# Patient Record
Sex: Female | Born: 1980 | Race: Black or African American | Hispanic: No | Marital: Single | Smoking: Never smoker
Health system: Southern US, Community
[De-identification: ages and names within clinical notes are randomized; demographics above are authoritative.]

## PROBLEM LIST (undated history)

## (undated) ENCOUNTER — Inpatient Hospital Stay (HOSPITAL_COMMUNITY): Payer: Self-pay

## (undated) DIAGNOSIS — N390 Urinary tract infection, site not specified: Secondary | ICD-10-CM

## (undated) DIAGNOSIS — R87619 Unspecified abnormal cytological findings in specimens from cervix uteri: Secondary | ICD-10-CM

## (undated) DIAGNOSIS — A599 Trichomoniasis, unspecified: Secondary | ICD-10-CM

## (undated) DIAGNOSIS — O139 Gestational [pregnancy-induced] hypertension without significant proteinuria, unspecified trimester: Secondary | ICD-10-CM

## (undated) DIAGNOSIS — A749 Chlamydial infection, unspecified: Secondary | ICD-10-CM

## (undated) HISTORY — PX: COLPOSCOPY: SHX161

## (undated) HISTORY — PX: DILATION AND CURETTAGE OF UTERUS: SHX78

---
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
## 2000-02-18 ENCOUNTER — Ambulatory Visit (HOSPITAL_COMMUNITY): Admission: RE | Admit: 2000-02-18 | Discharge: 2000-02-18 | Payer: Self-pay | Admitting: *Deleted

## 2000-02-27 ENCOUNTER — Inpatient Hospital Stay (HOSPITAL_COMMUNITY): Admission: AD | Admit: 2000-02-27 | Discharge: 2000-02-27 | Payer: Self-pay | Admitting: Obstetrics

## 2000-04-13 ENCOUNTER — Ambulatory Visit (HOSPITAL_COMMUNITY): Admission: RE | Admit: 2000-04-13 | Discharge: 2000-04-13 | Payer: Self-pay | Admitting: *Deleted

## 2000-04-13 ENCOUNTER — Encounter: Payer: Self-pay | Admitting: *Deleted

## 2000-05-21 ENCOUNTER — Inpatient Hospital Stay (HOSPITAL_COMMUNITY): Admission: AD | Admit: 2000-05-21 | Discharge: 2000-05-21 | Payer: Self-pay | Admitting: Obstetrics & Gynecology

## 2000-06-04 ENCOUNTER — Inpatient Hospital Stay (HOSPITAL_COMMUNITY): Admission: AD | Admit: 2000-06-04 | Discharge: 2000-06-07 | Payer: Self-pay | Admitting: *Deleted

## 2000-06-07 ENCOUNTER — Encounter: Payer: Self-pay | Admitting: Obstetrics

## 2000-06-17 ENCOUNTER — Encounter (HOSPITAL_COMMUNITY): Admission: RE | Admit: 2000-06-17 | Discharge: 2000-09-15 | Payer: Self-pay | Admitting: Obstetrics & Gynecology

## 2000-06-23 ENCOUNTER — Inpatient Hospital Stay (HOSPITAL_COMMUNITY): Admission: AD | Admit: 2000-06-23 | Discharge: 2000-06-25 | Payer: Self-pay | Admitting: Obstetrics

## 2000-06-23 DIAGNOSIS — O4210 Premature rupture of membranes, onset of labor more than 24 hours following rupture, unspecified weeks of gestation: Secondary | ICD-10-CM

## 2001-04-20 ENCOUNTER — Ambulatory Visit (HOSPITAL_COMMUNITY): Admission: RE | Admit: 2001-04-20 | Discharge: 2001-04-20 | Payer: Self-pay | Admitting: *Deleted

## 2001-04-21 ENCOUNTER — Encounter: Admission: RE | Admit: 2001-04-21 | Discharge: 2001-04-21 | Payer: Self-pay | Admitting: Obstetrics

## 2001-05-05 ENCOUNTER — Encounter: Admission: RE | Admit: 2001-05-05 | Discharge: 2001-05-05 | Payer: Self-pay | Admitting: Obstetrics

## 2001-05-26 ENCOUNTER — Inpatient Hospital Stay (HOSPITAL_COMMUNITY): Admission: AD | Admit: 2001-05-26 | Discharge: 2001-05-26 | Payer: Self-pay | Admitting: *Deleted

## 2001-05-26 ENCOUNTER — Encounter: Admission: RE | Admit: 2001-05-26 | Discharge: 2001-05-26 | Payer: Self-pay | Admitting: Obstetrics

## 2001-05-26 ENCOUNTER — Encounter: Payer: Self-pay | Admitting: Obstetrics & Gynecology

## 2001-05-30 ENCOUNTER — Encounter (HOSPITAL_COMMUNITY): Admission: RE | Admit: 2001-05-30 | Discharge: 2001-06-09 | Payer: Self-pay | Admitting: Obstetrics & Gynecology

## 2001-06-09 ENCOUNTER — Encounter: Admission: RE | Admit: 2001-06-09 | Discharge: 2001-06-09 | Payer: Self-pay | Admitting: Obstetrics

## 2001-06-14 ENCOUNTER — Inpatient Hospital Stay (HOSPITAL_COMMUNITY): Admission: AD | Admit: 2001-06-14 | Discharge: 2001-06-15 | Payer: Self-pay | Admitting: Obstetrics

## 2002-02-25 ENCOUNTER — Emergency Department (HOSPITAL_COMMUNITY): Admission: EM | Admit: 2002-02-25 | Discharge: 2002-02-25 | Payer: Self-pay | Admitting: Emergency Medicine

## 2002-07-12 ENCOUNTER — Emergency Department (HOSPITAL_COMMUNITY): Admission: EM | Admit: 2002-07-12 | Discharge: 2002-07-12 | Payer: Self-pay | Admitting: Emergency Medicine

## 2002-07-12 ENCOUNTER — Encounter: Payer: Self-pay | Admitting: Emergency Medicine

## 2002-12-14 ENCOUNTER — Emergency Department (HOSPITAL_COMMUNITY): Admission: EM | Admit: 2002-12-14 | Discharge: 2002-12-14 | Payer: Self-pay | Admitting: Emergency Medicine

## 2004-05-06 ENCOUNTER — Emergency Department (HOSPITAL_COMMUNITY): Admission: EM | Admit: 2004-05-06 | Discharge: 2004-05-06 | Payer: Self-pay | Admitting: Emergency Medicine

## 2004-07-12 ENCOUNTER — Emergency Department (HOSPITAL_COMMUNITY): Admission: EM | Admit: 2004-07-12 | Discharge: 2004-07-12 | Payer: Self-pay

## 2007-01-06 ENCOUNTER — Inpatient Hospital Stay (HOSPITAL_COMMUNITY): Admission: AD | Admit: 2007-01-06 | Discharge: 2007-01-06 | Payer: Self-pay | Admitting: Family Medicine

## 2007-03-31 ENCOUNTER — Ambulatory Visit (HOSPITAL_COMMUNITY): Admission: RE | Admit: 2007-03-31 | Discharge: 2007-03-31 | Payer: Self-pay | Admitting: Family Medicine

## 2007-06-02 ENCOUNTER — Ambulatory Visit: Payer: Self-pay | Admitting: Obstetrics & Gynecology

## 2007-06-02 ENCOUNTER — Inpatient Hospital Stay (HOSPITAL_COMMUNITY): Admission: AD | Admit: 2007-06-02 | Discharge: 2007-06-03 | Payer: Self-pay | Admitting: Obstetrics & Gynecology

## 2007-06-18 ENCOUNTER — Inpatient Hospital Stay (HOSPITAL_COMMUNITY): Admission: AD | Admit: 2007-06-18 | Discharge: 2007-06-18 | Payer: Self-pay | Admitting: Obstetrics & Gynecology

## 2007-06-18 ENCOUNTER — Ambulatory Visit: Payer: Self-pay | Admitting: Obstetrics and Gynecology

## 2007-07-01 ENCOUNTER — Inpatient Hospital Stay (HOSPITAL_COMMUNITY): Admission: AD | Admit: 2007-07-01 | Discharge: 2007-07-01 | Payer: Self-pay | Admitting: Obstetrics and Gynecology

## 2007-07-01 ENCOUNTER — Ambulatory Visit: Payer: Self-pay | Admitting: Obstetrics and Gynecology

## 2007-08-22 ENCOUNTER — Encounter: Payer: Self-pay | Admitting: Obstetrics and Gynecology

## 2007-08-22 ENCOUNTER — Ambulatory Visit: Payer: Self-pay | Admitting: Physician Assistant

## 2007-08-22 ENCOUNTER — Inpatient Hospital Stay (HOSPITAL_COMMUNITY): Admission: AD | Admit: 2007-08-22 | Discharge: 2007-08-24 | Payer: Self-pay | Admitting: Obstetrics and Gynecology

## 2010-05-11 ENCOUNTER — Observation Stay (HOSPITAL_COMMUNITY): Admission: EM | Admit: 2010-05-11 | Discharge: 2010-05-11 | Payer: Self-pay | Admitting: Emergency Medicine

## 2010-06-16 ENCOUNTER — Emergency Department (HOSPITAL_COMMUNITY): Admission: EM | Admit: 2010-06-16 | Discharge: 2010-06-16 | Payer: Self-pay | Admitting: Family Medicine

## 2010-11-06 LAB — POCT PREGNANCY, URINE: Preg Test, Ur: NEGATIVE

## 2010-11-06 LAB — COMPREHENSIVE METABOLIC PANEL
ALT: 16 U/L (ref 0–35)
BUN: 4 mg/dL — ABNORMAL LOW (ref 6–23)
CO2: 26 mEq/L (ref 19–32)
Calcium: 9.2 mg/dL (ref 8.4–10.5)
Creatinine, Ser: 0.58 mg/dL (ref 0.4–1.2)
GFR calc non Af Amer: >60 mL/min (ref >60)
Glucose, Bld: 75 mg/dL (ref 70–99)
Sodium: 137 mEq/L (ref 135–145)
Total Protein: 7.7 g/dL (ref 6.0–8.3)

## 2010-11-06 LAB — DIFFERENTIAL
Lymphocytes Relative: 11 % — ABNORMAL LOW (ref 12–46)
Lymphs Abs: 0.6 10*3/uL — ABNORMAL LOW (ref 0.7–4.0)
Monocytes Relative: 11 % (ref 3–12)
Neutro Abs: 4.2 10*3/uL (ref 1.7–7.7)
Neutrophils Relative %: 77 % (ref 43–77)

## 2010-11-06 LAB — URINALYSIS, ROUTINE W REFLEX MICROSCOPIC
Nitrite: NEGATIVE
Specific Gravity, Urine: 1.036 — ABNORMAL HIGH (ref 1.005–1.030)
pH: 6 (ref 5.0–8.0)

## 2010-11-06 LAB — CBC
HCT: 43.6 % (ref 36.0–46.0)
MCH: 26.6 pg (ref 26.0–34.0)
MCHC: 34.2 g/dL (ref 30.0–36.0)
RDW: 13.2 % (ref 11.5–15.5)

## 2010-11-06 LAB — URINE MICROSCOPIC-ADD ON

## 2010-11-23 ENCOUNTER — Inpatient Hospital Stay (INDEPENDENT_AMBULATORY_CARE_PROVIDER_SITE_OTHER)
Admission: RE | Admit: 2010-11-23 | Discharge: 2010-11-23 | Disposition: A | Discharge status: Home / Self Care | Payer: Self-pay | Source: Ambulatory Visit | Attending: Family Medicine | Admitting: Family Medicine

## 2010-11-23 DIAGNOSIS — A499 Bacterial infection, unspecified: Secondary | ICD-10-CM

## 2010-11-23 DIAGNOSIS — N760 Acute vaginitis: Secondary | ICD-10-CM

## 2010-11-23 LAB — WET PREP, GENITAL
Trich, Wet Prep: NONE SEEN
Yeast Wet Prep HPF POC: NONE SEEN

## 2010-11-23 LAB — POCT PREGNANCY, URINE: Preg Test, Ur: NEGATIVE

## 2010-11-23 LAB — POCT URINALYSIS DIP (DEVICE)
Bilirubin Urine: NEGATIVE
Glucose, UA: NEGATIVE mg/dL
Ketones, ur: NEGATIVE mg/dL
Nitrite: NEGATIVE
pH: 5.5 (ref 5.0–8.0)

## 2010-11-24 LAB — GC/CHLAMYDIA PROBE AMP, GENITAL: GC Probe Amp, Genital: NEGATIVE

## 2010-11-26 LAB — HERPES SIMPLEX VIRUS CULTURE: Culture: NOT DETECTED

## 2011-01-09 NOTE — Discharge Summary (Signed)
[facility]  Patient:    Kimberly Bennett, Kimberly Bennett                     MRN: [id number] Adm. Date:  [id number] Disc. Date: [id number] Attending:  Tammi Sou Dictator:   Andrey Spearman, M.D.                           Discharge Summary  DISCHARGE DIAGNOSES:          1. Preterm labor.                               2. Cervicitis.                               3. Intrauterine pregnancy at 34-3/7 weeks.  DISCHARGE MEDICATIONS:        1. Augmentin 875 mg p.o. b.i.d. x 5 days.                               2. Prenatal vitamins.  BRIEF ADMISSION HISTORY:      This patient is an 30 year old G2, P0-0-1-0, admitted at 34-0/7 weeks by LMP at 19-week ultrasound, who presented with a one-day history of diarrhea ______  and abdominal cramping, with no nausea, vomiting, fever, chills or leakage of fluid.  Patient was reporting some contractions on admission but reported good fetal movement.  Prenatal labs are significant for an unknown group B strep status.  PHYSICAL EXAMINATION:         On admission, she was afebrile.  Her pulse was 85.  Blood pressure was 110/58.  Fetal heart rate was 120 to 135 with reactivity and no decelerations and uterine irritability on the monitor. Patients cervix on admission was fingertip, 60% and -2, with lower uterine segment development.  LABORATORY DATA:              Her wet prep showed too numerous to count white blood cells and too numerous to count bacteria.  UA showed positive ketones and was otherwise negative.  Patient was admitted with the diagnoses of cervicitis, dehydration and preterm labor.  HOSPITAL COURSE:              Patient was admitted, begun on 3 g of Unasyn IV q.6h. and begun on IV fluid rehydration.  On the first day of admission, she continued to have a great deal of uterine irritability with occasional contractions, but she was continued on monitoring, bedrest, hydration and Unasyn.  Later on hospital  day #1, patient reported increased frequency of uterine contractions and increased pressure; her cervix, however, was unchanged and she was given one dose of subcu terbutaline and then continued monitoring.  On hospital day #2, patient was having only intermittent uterine contractions.  Fetal heart tones remained reassuring, cervix was unchanged and she continued on Unasyn and IV hydration.  On hospital day #3, patient was having occasional uterine contractions with good fetal movement and no leakage of fluid.  She finished a 72-hour course of Unasyn.  Patient had an ultrasound done to evaluate renal pyelectasis seen on a previous ultrasound and per preliminary report, it does not appear that any pyelectasis was seen on this repeat ultrasound.  Patient was sent home with five days of Augmentin  and told to remain on bedrest throughout the remainder of her pregnancy and was instructed to follow up at her scheduled prenatal visit.  Patient is discharged in stable condition. DD:  06/07/00 TD:  06/08/00 Job: [id number]

## 2011-03-05 ENCOUNTER — Inpatient Hospital Stay (HOSPITAL_COMMUNITY)
Admission: AD | Admit: 2011-03-05 | Discharge: 2011-03-06 | Disposition: A | Discharge status: Home / Self Care | Payer: Medicaid Other | Source: Ambulatory Visit | Attending: Obstetrics & Gynecology | Admitting: Obstetrics & Gynecology

## 2011-03-05 ENCOUNTER — Inpatient Hospital Stay (HOSPITAL_COMMUNITY): Payer: Medicaid Other

## 2011-03-05 ENCOUNTER — Encounter (HOSPITAL_COMMUNITY): Payer: Self-pay | Admitting: *Deleted

## 2011-03-05 DIAGNOSIS — B9689 Other specified bacterial agents as the cause of diseases classified elsewhere: Secondary | ICD-10-CM | POA: Insufficient documentation

## 2011-03-05 DIAGNOSIS — O2390 Unspecified genitourinary tract infection in pregnancy, unspecified trimester: Secondary | ICD-10-CM | POA: Insufficient documentation

## 2011-03-05 DIAGNOSIS — R109 Unspecified abdominal pain: Secondary | ICD-10-CM | POA: Insufficient documentation

## 2011-03-05 DIAGNOSIS — N760 Acute vaginitis: Secondary | ICD-10-CM | POA: Diagnosis present

## 2011-03-05 DIAGNOSIS — O209 Hemorrhage in early pregnancy, unspecified: Secondary | ICD-10-CM | POA: Insufficient documentation

## 2011-03-05 DIAGNOSIS — Z3490 Encounter for supervision of normal pregnancy, unspecified, unspecified trimester: Secondary | ICD-10-CM

## 2011-03-05 DIAGNOSIS — A499 Bacterial infection, unspecified: Secondary | ICD-10-CM | POA: Insufficient documentation

## 2011-03-05 HISTORY — DX: Chlamydial infection, unspecified: A74.9

## 2011-03-05 HISTORY — DX: Unspecified abnormal cytological findings in specimens from cervix uteri: R87.619

## 2011-03-05 HISTORY — DX: Urinary tract infection, site not specified: N39.0

## 2011-03-05 HISTORY — DX: Trichomoniasis, unspecified: A59.9

## 2011-03-05 LAB — URINALYSIS, ROUTINE W REFLEX MICROSCOPIC
Glucose, UA: NEGATIVE mg/dL
Leukocytes, UA: NEGATIVE
Protein, ur: NEGATIVE mg/dL
Specific Gravity, Urine: >1.03 — ABNORMAL HIGH (ref 1.005–1.030)
Urobilinogen, UA: 1 mg/dL (ref 0.0–1.0)

## 2011-03-05 LAB — WET PREP, GENITAL
Trich, Wet Prep: NONE SEEN
Yeast Wet Prep HPF POC: NONE SEEN

## 2011-03-05 LAB — CBC
Hemoglobin: 12.5 g/dL (ref 12.0–15.0)
MCH: 26.5 pg (ref 26.0–34.0)
MCHC: 33.3 g/dL (ref 30.0–36.0)
MCV: 79.4 fL (ref 78.0–100.0)
RBC: 4.72 MIL/uL (ref 3.87–5.11)

## 2011-03-05 LAB — POCT PREGNANCY, URINE: Preg Test, Ur: POSITIVE

## 2011-03-05 LAB — URINE MICROSCOPIC-ADD ON

## 2011-03-05 NOTE — ED Provider Notes (Signed)
History   Chief Complaint:  Abdominal Pain and Vaginal Bleeding   Kimberly Bennett is  30 y.o. [phone].  Patient's last menstrual period was 02/01/2011.Marland Kitchen  Her pregnancy status is positive. Pt is [redacted]w[redacted]d by LMP. She presents complaining of Abdominal Pain and Vaginal Bleeding . Onset is described as ongoing and has been present for  2 weeks.   OB History    Grav Para Term Preterm Abortions TAB SAB Ect Mult Living   5 3 2 1 1  1   3        Past Medical History  Diagnosis Date  . Urinary tract infection   . Abnormal Pap smear of cervix   . Chlamydia   . Trichomonas     Past Surgical History  Procedure Date  . Colposcopy     No family history on file.  History  Substance Use Topics  . Smoking status: Never Smoker   . Smokeless tobacco: Former Neurosurgeon  . Alcohol Use: No    Allergies:  Allergies  Allergen Reactions  . Penicillins Hives and Itching    No prescriptions prior to admission    Review of Systems -  History obtained from the patient Gastrointestinal ROS: positive for - abdominal pain Genito-Urinary ROS: positive for - vag spotting since the beginning of July  Physical Exam   Blood pressure 135/86, pulse 77, temperature 98.3 F (36.8 C), temperature source Oral, resp. rate 18, height 5\' 5"  (1.651 m), weight 122 lb (55.339 kg), last menstrual period 02/01/2011.  General: General appearance - alert, well appearing, and in no distress and oriented to person, place, and time Mental status - alert, oriented to person, place, and time, normal mood, behavior, speech, dress, motor activity, and thought processes Focused Gynecological Exam: normal external genitalia, vulva, vagina, cervix, uterus and adnexa, VULVA: normal appearing vulva with no masses, tenderness or lesions, VAGINA: normal appearing vagina with normal color and discharge, no lesions, vaginal discharge - bloody and malodorous, CERVIX: normal appearing cervix without discharge or lesions, UTERUS: uterus  is normal size, shape, consistency and nontender, Nonenlarged, nontender, ADNEXA: tenderness left, slightly enlarged on , WET MOUNT done - results: DNA probe for chlamydia and GC obtained, exam chaperoned by Jeris Penta, RN  Labs: Recent Results (from the past 24 hour(s))  URINALYSIS, ROUTINE W REFLEX MICROSCOPIC   Collection Time   03/05/11  6:20 PM      Component Value Range   Color, Urine YELLOW  YELLOW    Appearance HAZY (*) CLEAR    Specific Gravity, Urine >1.030 (*) 1.005 - 1.030    pH 6.0  5.0 - 8.0    Glucose, UA NEGATIVE  NEGATIVE (mg/dL)   Hgb urine dipstick MODERATE (*) NEGATIVE    Bilirubin Urine NEGATIVE  NEGATIVE    Ketones NEGATIVE  NEGATIVE (mg/dL)   Protein NEGATIVE  NEGATIVE (mg/dL)   Urobilinogen, UA 1.0  0.0 - 1.0 (mg/dL)   Nitrite NEGATIVE  NEGATIVE    Leukocytes, UA NEGATIVE  NEGATIVE   URINE MICROSCOPIC-ADD ON   Collection Time   03/05/11  6:20 PM      Component Value Range   Squamous Epithelial / LPF MANY (*) RARE    WBC, UA 3-6  <3 (WBC/hpf)   RBC / HPF 0-2  <3 (RBC/hpf)   Bacteria, UA FEW (*) RARE    Urine-Other MUCOUS PRESENT    POCT PREGNANCY, URINE   Collection Time   03/05/11  6:32 PM      Component Value  Range   Preg Test, Ur POSITIVE    POCT PREGNANCY, URINE   Collection Time   03/05/11  9:53 PM      Component Value Range   Preg Test, Ur POSITIVE    CBC   Collection Time   03/05/11  9:55 PM      Component Value Range   WBC 7.6  4.0 - 10.5 (K/uL)   RBC 4.72  3.87 - 5.11 (MIL/uL)   Hemoglobin 12.5  12.0 - 15.0 (g/dL)   HCT 19.1  47.8 - 29.5 (%)   MCV 79.4  78.0 - 100.0 (fL)   MCH 26.5  26.0 - 34.0 (pg)   MCHC 33.3  30.0 - 36.0 (g/dL)   RDW 62.1  30.8 - 65.7 (%)   Platelets 291  150 - 400 (K/uL)  ABO/RH   Collection Time   03/05/11  9:55 PM      Component Value Range   ABO/RH(D) O POS    HCG, QUANTITATIVE, PREGNANCY   Collection Time   03/05/11  9:55 PM      Component Value Range   hCG, Beta Chain, Quant, S 7671 (*) <5 (mIU/mL)  WET  PREP, GENITAL   Collection Time   03/05/11 11:22 PM      Component Value Range   Yeast, Wet Prep NONE SEEN  NONE SEEN    Trich, Wet Prep NONE SEEN  NONE SEEN    Clue Cells, Wet Prep MODERATE (*) NONE SEEN    WBC, Wet Prep HPF POC MODERATE (*) NONE SEEN     Ultrasound Studies:  *RADIOLOGY REPORT*  Clinical Data: Pain and spotting. Estimated gestational age by LMP  4 weeks 4 days  OBSTETRIC <14 WK [place] AND TRANSVAGINAL OB US  Technique: Both transabdominal and transvaginal ultrasound  examinations were performed for complete evaluation of the  gestation as well as the maternal uterus, adnexal regions, and  pelvic cul-de-sac. Transvaginal technique was performed to assess  early pregnancy.  Comparison: None.  Intrauterine gestational sac: A single intrauterine gestational  sac is demonstrated.  Yolk sac: The yolk sac is visualized.  Embryo: Fetal pole is not demonstrated. This is likely due to  early gestational age and too small to visualize.  Cardiac Activity: Fetal cardiac activity not demonstrated.  MSD: 8 mm 5 w 3 d [place] EDC: 11/02/2011  Maternal uterus/adnexae:  Small amount of free fluid in the pelvis. Simple appearing cyst in  the right ovary measuring about 3.5 x 3 x 3 cm, likely representing  corpus luteal cyst. Left ovary is unremarkable.  IMPRESSION:  Single intrauterine gestational sac with yolk sac visualized.  Fetal pole is not demonstrated, likely due to early gestational  age. Estimated gestational age by sac diameter is 5 weeks 3 days.  Small amount of free fluid in the pelvis. Simple cysts in the  right ovary.  Original Report Authenticated By: Marlon Pel, M.D.    Assessment: Patient Active Problem List  Diagnoses  . Normal IUP (intrauterine pregnancy) on prenatal ultrasound  . Bacterial vaginal infection     Plan: Rx Flagyl 500mg  po BIDx 7 days Follow up with the provider of your choice for care.  Eduard Penkala E. 03/06/2011, 12:06 AM

## 2011-03-06 LAB — GC/CHLAMYDIA PROBE AMP, GENITAL: Chlamydia, DNA Probe: NEGATIVE

## 2011-03-07 NOTE — ED Provider Notes (Signed)
Client called on 03-06-11.  Pharmacy did not get her prescription ordered earlier this week.  Reviewed note and called in Metronidazole 500 mg po bid x 7 days (#14) no refills to [facility] on [address].  Nolene Bernheim, NP 03/07/11 0425

## 2011-03-11 ENCOUNTER — Encounter (HOSPITAL_COMMUNITY): Payer: Self-pay | Admitting: *Deleted

## 2011-03-11 ENCOUNTER — Inpatient Hospital Stay (HOSPITAL_COMMUNITY)
Admission: AD | Admit: 2011-03-11 | Discharge: 2011-03-12 | Disposition: A | Discharge status: Home / Self Care | Payer: Medicaid Other | Source: Ambulatory Visit | Attending: Obstetrics and Gynecology | Admitting: Obstetrics and Gynecology

## 2011-03-11 DIAGNOSIS — O210 Mild hyperemesis gravidarum: Secondary | ICD-10-CM

## 2011-03-11 LAB — URINE MICROSCOPIC-ADD ON

## 2011-03-11 LAB — URINALYSIS, ROUTINE W REFLEX MICROSCOPIC
Bilirubin Urine: NEGATIVE
Glucose, UA: NEGATIVE mg/dL
Ketones, ur: 15 mg/dL — AB
pH: 6 (ref 5.0–8.0)

## 2011-03-11 MED ORDER — ONDANSETRON HCL 4 MG/2ML IJ SOLN
4.0000 mg | Freq: Once | INTRAMUSCULAR | Status: AC
  Administered 2011-03-11: 4 mg via INTRAVENOUS
  Filled 2011-03-11: qty 2

## 2011-03-11 MED ORDER — PROMETHAZINE HCL 12.5 MG PO TABS: 12.5000 mg | ORAL_TABLET | Freq: Four times a day (QID) | ORAL | Status: DC | PRN

## 2011-03-11 MED ORDER — LACTATED RINGERS IV SOLN
INTRAVENOUS | Status: DC
  Administered 2011-03-11: 22:00:00 via INTRAVENOUS

## 2011-03-11 NOTE — ED Provider Notes (Signed)
History     Chief Complaint  Patient presents with  . Emesis During Pregnancy   HPI  Patient is here with report of emesis of pregnancy for the past 3 days. Patient denies being able to tolerate by mouth fluids or food. Denies fever body aches or chills or any signs of an infectious process.  Past Medical History  Diagnosis Date  . Urinary tract infection   . Chlamydia   . Trichomonas   . Abnormal Pap smear of cervix     colposcopy 2012, Jan    Past Surgical History  Procedure Date  . Colposcopy   . No past surgeries     No family history on file.  History  Substance Use Topics  . Smoking status: Never Smoker   . Smokeless tobacco: Never Used  . Alcohol Use: No    Allergies:  Allergies  Allergen Reactions  . Penicillins Hives and Itching    No prescriptions prior to admission    Review of Systems  Constitutional: Positive for malaise/fatigue. Negative for fever and chills.  Gastrointestinal: Positive for nausea and vomiting. Negative for abdominal pain.  Genitourinary:       No vaginal bleeding  Skin: Negative.   Neurological: Negative for headaches.   Physical Exam   Blood pressure 122/70, pulse 85, temperature 99.2 F (37.3 C), temperature source Oral, resp. rate 16, height 5\' 5"  (1.651 m), weight 57.834 kg (127 lb 8 oz), last menstrual period 02/01/2011.  Physical Exam  Constitutional: She is oriented to person, place, and time. She appears well-developed and well-nourished.  HENT:  Head: Normocephalic.  Mouth/Throat: Mucous membranes are dry.  Neck: Normal range of motion. Neck supple.  Cardiovascular: Normal rate, regular rhythm and normal heart sounds.   Respiratory: Effort normal and breath sounds normal.  GI: There is no tenderness.  Genitourinary: No bleeding around the vagina. No vaginal discharge (mucusy) found.  Neurological: She is alert and oriented to person, place, and time. She has normal reflexes.  Skin: Skin is warm and dry. She  is not diaphoretic.    MAU Course  Procedures  IV fluids Antiemetics  Assessment and Plan  Hyperemesis of Pregnancy  DC to home RX phenergan   [facility] 03/11/2011, 9:45 PM

## 2011-03-11 NOTE — Progress Notes (Signed)
Pt tolerating crackers and soda without difficulty.

## 2011-03-11 NOTE — Progress Notes (Signed)
Pt reports improvement in nausea since IV fluids and meds>try PO crackers.

## 2011-03-11 NOTE — Progress Notes (Signed)
Pt LMP 02/01/2011, G5 P3, having N/V, unable to keep anything down x 3 days.  Pt reports vomiting x 3 today.

## 2011-03-11 NOTE — Progress Notes (Signed)
Patient states has not eaten in 3 days unable to keep anything down, has only urinated x 1 today, was seen in MAU

## 2011-04-07 ENCOUNTER — Other Ambulatory Visit: Payer: Self-pay | Admitting: Family Medicine

## 2011-04-07 DIAGNOSIS — Z3682 Encounter for antenatal screening for nuchal translucency: Secondary | ICD-10-CM

## 2011-04-07 LAB — HIV ANTIBODY (ROUTINE TESTING W REFLEX): HIV: NONREACTIVE

## 2011-04-07 LAB — RUBELLA ANTIBODY, IGM: Rubella: IMMUNE

## 2011-04-07 LAB — VARICELLA ZOSTER ANTIBODY, IGG: Varicella: NON-IMMUNE/NOT IMMUNE

## 2011-04-07 LAB — RPR: RPR: NONREACTIVE

## 2011-04-07 LAB — HEPATITIS B SURFACE ANTIGEN: Hepatitis B Surface Ag: NEGATIVE

## 2011-04-08 LAB — ANTIBODY SCREEN: Antibody Screen: NEGATIVE

## 2011-04-29 ENCOUNTER — Ambulatory Visit (HOSPITAL_COMMUNITY)
Admission: RE | Admit: 2011-04-29 | Discharge: 2011-04-29 | Disposition: A | Discharge status: Home / Self Care | Payer: Medicaid Other | Source: Ambulatory Visit | Attending: Family Medicine | Admitting: Family Medicine

## 2011-04-29 DIAGNOSIS — Z3689 Encounter for other specified antenatal screening: Secondary | ICD-10-CM | POA: Insufficient documentation

## 2011-04-29 DIAGNOSIS — O3510X0 Maternal care for (suspected) chromosomal abnormality in fetus, unspecified, not applicable or unspecified: Secondary | ICD-10-CM | POA: Insufficient documentation

## 2011-04-29 DIAGNOSIS — O351XX0 Maternal care for (suspected) chromosomal abnormality in fetus, not applicable or unspecified: Secondary | ICD-10-CM | POA: Insufficient documentation

## 2011-04-29 DIAGNOSIS — Z3682 Encounter for antenatal screening for nuchal translucency: Secondary | ICD-10-CM

## 2011-05-04 ENCOUNTER — Other Ambulatory Visit: Payer: Self-pay | Admitting: Maternal and Fetal Medicine

## 2011-05-29 LAB — RPR: RPR Ser Ql: NONREACTIVE

## 2011-05-29 LAB — CBC
RBC: 4.46
WBC: 10

## 2011-06-01 ENCOUNTER — Other Ambulatory Visit: Payer: Self-pay | Admitting: Family Medicine

## 2011-06-01 DIAGNOSIS — O99820 Streptococcus B carrier state complicating pregnancy: Secondary | ICD-10-CM

## 2011-06-01 DIAGNOSIS — Z3689 Encounter for other specified antenatal screening: Secondary | ICD-10-CM

## 2011-06-02 LAB — URINALYSIS, ROUTINE W REFLEX MICROSCOPIC
Leukocytes, UA: NEGATIVE
Nitrite: NEGATIVE
Specific Gravity, Urine: >1.03 — ABNORMAL HIGH
pH: 6

## 2011-06-02 LAB — URINE MICROSCOPIC-ADD ON

## 2011-06-03 LAB — URINALYSIS, ROUTINE W REFLEX MICROSCOPIC
Glucose, UA: NEGATIVE
Protein, ur: NEGATIVE

## 2011-06-03 LAB — CBC
HCT: 35.2 — ABNORMAL LOW
MCV: 80.5
Platelets: 327
RDW: 13.9

## 2011-06-03 LAB — URINE MICROSCOPIC-ADD ON

## 2011-06-03 LAB — WET PREP, GENITAL: Yeast Wet Prep HPF POC: NONE SEEN

## 2011-06-04 LAB — GC/CHLAMYDIA PROBE AMP, GENITAL
Chlamydia, DNA Probe: POSITIVE — AB
GC Probe Amp, Genital: NEGATIVE

## 2011-06-04 LAB — WET PREP, GENITAL: Trich, Wet Prep: NONE SEEN

## 2011-06-04 LAB — URINALYSIS, ROUTINE W REFLEX MICROSCOPIC
Glucose, UA: 100 — AB
Hgb urine dipstick: NEGATIVE
Ketones, ur: 15 — AB
Protein, ur: 100 — AB

## 2011-06-04 LAB — URINE MICROSCOPIC-ADD ON

## 2011-06-09 ENCOUNTER — Ambulatory Visit (HOSPITAL_COMMUNITY)
Admission: RE | Admit: 2011-06-09 | Discharge: 2011-06-09 | Disposition: A | Discharge status: Home / Self Care | Payer: Medicaid Other | Source: Ambulatory Visit | Attending: Family Medicine | Admitting: Family Medicine

## 2011-06-09 DIAGNOSIS — O99820 Streptococcus B carrier state complicating pregnancy: Secondary | ICD-10-CM

## 2011-06-09 DIAGNOSIS — O139 Gestational [pregnancy-induced] hypertension without significant proteinuria, unspecified trimester: Secondary | ICD-10-CM | POA: Insufficient documentation

## 2011-06-09 DIAGNOSIS — Z363 Encounter for antenatal screening for malformations: Secondary | ICD-10-CM | POA: Insufficient documentation

## 2011-06-09 DIAGNOSIS — Z3689 Encounter for other specified antenatal screening: Secondary | ICD-10-CM

## 2011-06-09 DIAGNOSIS — Z1389 Encounter for screening for other disorder: Secondary | ICD-10-CM | POA: Insufficient documentation

## 2011-06-09 DIAGNOSIS — O358XX0 Maternal care for other (suspected) fetal abnormality and damage, not applicable or unspecified: Secondary | ICD-10-CM | POA: Insufficient documentation

## 2011-07-27 ENCOUNTER — Other Ambulatory Visit (HOSPITAL_COMMUNITY): Payer: Self-pay | Admitting: Physician Assistant

## 2011-08-19 ENCOUNTER — Ambulatory Visit (HOSPITAL_COMMUNITY)
Admission: RE | Admit: 2011-08-19 | Discharge: 2011-08-19 | Disposition: A | Discharge status: Home / Self Care | Payer: Medicaid Other | Source: Ambulatory Visit | Attending: Physician Assistant | Admitting: Physician Assistant

## 2011-08-19 DIAGNOSIS — Z3689 Encounter for other specified antenatal screening: Secondary | ICD-10-CM | POA: Insufficient documentation

## 2011-08-19 DIAGNOSIS — O3660X0 Maternal care for excessive fetal growth, unspecified trimester, not applicable or unspecified: Secondary | ICD-10-CM | POA: Insufficient documentation

## 2011-08-19 DIAGNOSIS — O139 Gestational [pregnancy-induced] hypertension without significant proteinuria, unspecified trimester: Secondary | ICD-10-CM | POA: Insufficient documentation

## 2011-08-25 NOTE — L&D Delivery Note (Signed)
Delivery Note At 7:05 AM a viable female was delivered via Vaginal, Spontaneous Delivery (Presentation: Left Occiput Anterior).  APGAR: 9, 9; weight 7 lb 14.6 oz (3589 g).   Placenta status: Intact, Spontaneous.  Cord: 3 vessels with the following complications: None.  Cord pH: n/a  Anesthesia: Epidural  Episiotomy: None Lacerations: None Suture Repair: none Est. Blood Loss (mL): 300  Mom to postpartum.  Baby to nursery-stable.  Kimberly Bennett C. 10/27/2011, 7:20 AM

## 2011-09-04 ENCOUNTER — Inpatient Hospital Stay (HOSPITAL_COMMUNITY)
Admission: AD | Admit: 2011-09-04 | Discharge: 2011-09-04 | Disposition: A | Discharge status: Home / Self Care | Payer: Medicaid Other | Source: Ambulatory Visit | Attending: Obstetrics & Gynecology | Admitting: Obstetrics & Gynecology

## 2011-09-04 ENCOUNTER — Encounter (HOSPITAL_COMMUNITY): Payer: Self-pay | Admitting: *Deleted

## 2011-09-04 DIAGNOSIS — O4700 False labor before 37 completed weeks of gestation, unspecified trimester: Secondary | ICD-10-CM | POA: Insufficient documentation

## 2011-09-04 LAB — URINALYSIS, ROUTINE W REFLEX MICROSCOPIC
Bilirubin Urine: NEGATIVE
Ketones, ur: 40 mg/dL — AB
Protein, ur: NEGATIVE mg/dL
Specific Gravity, Urine: 1.02 (ref 1.005–1.030)
Urobilinogen, UA: 1 mg/dL (ref 0.0–1.0)

## 2011-09-04 LAB — URINE MICROSCOPIC-ADD ON

## 2011-09-04 MED ORDER — NIFEDIPINE 10 MG PO CAPS
10.0000 mg | ORAL_CAPSULE | Freq: Once | ORAL | Status: AC
  Administered 2011-09-04: 10 mg via ORAL
  Filled 2011-09-04: qty 1

## 2011-09-04 NOTE — ED Provider Notes (Cosign Needed)
History     CSN: [id number]  Arrival date & time 09/04/11  1648   None     Chief Complaint  Patient presents with  . Contractions    (Consider location/radiation/quality/duration/timing/severity/associated sxs/prior treatment) HPI 75yr [id number] at 30.5wks presents with crampy abdominal pain since this afternoon. Denies any bleeding or passing of fluid. States good fetal movement. Has a Hx of preterm labor.    Past Medical History  Diagnosis Date  . Urinary tract infection   . Chlamydia   . Trichomonas   . Abnormal Pap smear of cervix     colposcopy 2012, Jan    Past Surgical History  Procedure Date  . Colposcopy   . No past surgeries     No family history on file.  History  Substance Use Topics  . Smoking status: Never Smoker   . Smokeless tobacco: Never Used  . Alcohol Use: No    OB History    Grav Para Term Preterm Abortions TAB SAB Ect Mult Living   5 3 2 1 1  1   3       Review of Systems  Constitutional: Negative for fever.  Respiratory: Negative for shortness of breath.   Cardiovascular: Negative for chest pain.  Gastrointestinal: Negative for abdominal distention.  Genitourinary: Negative for dysuria and difficulty urinating.  All other systems reviewed and are negative.    Allergies  Penicillins  Home Medications  No current outpatient prescriptions on file.  BP 113/66  Pulse 108  Temp(Src) 99 F (37.2 C) (Oral)  Resp 18  Ht 5' 4.5" (1.638 m)  Wt 73.596 kg (162 lb 4 oz)  BMI 27.42 kg/m2  LMP 02/01/2011  Physical Exam  Constitutional: She is oriented to person, place, and time. She appears well-developed and well-nourished. No distress.  Cardiovascular: Normal rate, regular rhythm and intact distal pulses.  Exam reveals no gallop and no friction rub.   No murmur heard. Pulmonary/Chest: Effort normal and breath sounds normal. No respiratory distress. She has no wheezes. She has no rales.  Abdominal: Soft. Bowel sounds are normal. She  exhibits no distension and no mass. There is no tenderness. There is no rebound and no guarding.  Genitourinary: Vagina normal. There is no rash, tenderness, lesion or injury on the right labia. There is no rash, tenderness, lesion or injury on the left labia. Uterus is not deviated, not enlarged, not fixed and not tender. Cervix exhibits no motion tenderness, no discharge and no friability. Right adnexum displays no mass, no tenderness and no fullness. Left adnexum displays no mass, no tenderness and no fullness. No erythema, tenderness or bleeding around the vagina. No foreign body around the vagina. No signs of injury around the vagina. No vaginal discharge found.  Musculoskeletal: Normal range of motion. She exhibits no edema.  Neurological: She is alert and oriented to person, place, and time.  Skin: Skin is warm and dry. She is not diaphoretic.  Psychiatric: She has a normal mood and affect. Her behavior is normal. Judgment and thought content normal.    ED Course  Procedures (including critical care time)  1715: initial evaluation of patient, performed manual cervical exam, cervix closed and thick. Exam grossly normal. AFVSS. Ua results pending Will try tocolysis with nifedipine and re-eval  1914: patient reports improvement of Sx. UA negative for infection.  Discussed risks and signs of preterm labor, and instructed patient on these signs and when to return to the ED or call her PCP. Patient voiced understanding, all  questions and concerns addressed. Will DC patient to home with followup at next appointment.     Labs Reviewed  URINALYSIS, ROUTINE W REFLEX MICROSCOPIC - Abnormal; Notable for the following:    Hgb urine dipstick TRACE (*)    Ketones, ur 40 (*)    All other components within normal limits  URINE MICROSCOPIC-ADD ON - Abnormal; Notable for the following:    Squamous Epithelial / LPF FEW (*)    All other components within normal limits   No results found.   No  diagnosis found.    MDM    Contractions  - patient does not appear in preterm labor  - probably braxton hicks CTX  - DC to home, f/u at next OB appt  Pregnancy  - baby category I on monitor  - progressing as expected  Dispo- DC to home/

## 2011-09-04 NOTE — Progress Notes (Signed)
Pt states, "  I started having yesteaday with a lot of pressure and cramping in my sides when I stand to walk."

## 2011-10-27 ENCOUNTER — Encounter (HOSPITAL_COMMUNITY): Payer: Self-pay | Admitting: Anesthesiology

## 2011-10-27 ENCOUNTER — Inpatient Hospital Stay (HOSPITAL_COMMUNITY)
Admission: AD | Admit: 2011-10-27 | Discharge: 2011-10-29 | DRG: 775 | Disposition: A | Discharge status: Home Health | Payer: Medicaid Other | Source: Ambulatory Visit | Attending: Obstetrics & Gynecology | Admitting: Obstetrics & Gynecology

## 2011-10-27 ENCOUNTER — Encounter (HOSPITAL_COMMUNITY): Payer: Self-pay | Admitting: *Deleted

## 2011-10-27 ENCOUNTER — Inpatient Hospital Stay (HOSPITAL_COMMUNITY): Payer: Medicaid Other | Admitting: Anesthesiology

## 2011-10-27 DIAGNOSIS — B9689 Other specified bacterial agents as the cause of diseases classified elsewhere: Secondary | ICD-10-CM

## 2011-10-27 DIAGNOSIS — Z3490 Encounter for supervision of normal pregnancy, unspecified, unspecified trimester: Secondary | ICD-10-CM

## 2011-10-27 DIAGNOSIS — O9989 Other specified diseases and conditions complicating pregnancy, childbirth and the puerperium: Secondary | ICD-10-CM

## 2011-10-27 DIAGNOSIS — O09899 Supervision of other high risk pregnancies, unspecified trimester: Secondary | ICD-10-CM

## 2011-10-27 DIAGNOSIS — O99892 Other specified diseases and conditions complicating childbirth: Principal | ICD-10-CM | POA: Diagnosis present

## 2011-10-27 DIAGNOSIS — J45909 Unspecified asthma, uncomplicated: Secondary | ICD-10-CM | POA: Diagnosis present

## 2011-10-27 DIAGNOSIS — Z22330 Carrier of Group B streptococcus: Secondary | ICD-10-CM

## 2011-10-27 DIAGNOSIS — IMO0001 Reserved for inherently not codable concepts without codable children: Secondary | ICD-10-CM

## 2011-10-27 HISTORY — DX: Gestational (pregnancy-induced) hypertension without significant proteinuria, unspecified trimester: O13.9

## 2011-10-27 LAB — COMPREHENSIVE METABOLIC PANEL
ALT: 8 U/L (ref 0–35)
AST: 17 U/L (ref 0–37)
Albumin: 2.6 g/dL — ABNORMAL LOW (ref 3.5–5.2)
Calcium: 9.2 mg/dL (ref 8.4–10.5)
Chloride: 102 mEq/L (ref 96–112)
Creatinine, Ser: 0.45 mg/dL — ABNORMAL LOW (ref 0.50–1.10)
Sodium: 133 mEq/L — ABNORMAL LOW (ref 135–145)
Total Bilirubin: 0.4 mg/dL (ref 0.3–1.2)

## 2011-10-27 LAB — CBC
MCH: 21.1 pg — ABNORMAL LOW (ref 26.0–34.0)
Platelets: 354 10*3/uL (ref 150–400)
RBC: 4.7 MIL/uL (ref 3.87–5.11)

## 2011-10-27 LAB — RPR: RPR Ser Ql: NONREACTIVE

## 2011-10-27 MED ORDER — PHENYLEPHRINE 40 MCG/ML (10ML) SYRINGE FOR IV PUSH (FOR BLOOD PRESSURE SUPPORT): 80.0000 ug | PREFILLED_SYRINGE | INTRAVENOUS | Status: DC | PRN

## 2011-10-27 MED ORDER — PHENYLEPHRINE 40 MCG/ML (10ML) SYRINGE FOR IV PUSH (FOR BLOOD PRESSURE SUPPORT)
PREFILLED_SYRINGE | INTRAVENOUS | Status: AC
  Filled 2011-10-27: qty 5

## 2011-10-27 MED ORDER — PRENATAL MULTIVITAMIN CH
1.0000 | ORAL_TABLET | Freq: Every day | ORAL | Status: DC
  Administered 2011-10-27 – 2011-10-29 (×3): 1 via ORAL
  Filled 2011-10-27 (×2): qty 1

## 2011-10-27 MED ORDER — WITCH HAZEL-GLYCERIN EX PADS: 1.0000 "application " | MEDICATED_PAD | CUTANEOUS | Status: DC | PRN

## 2011-10-27 MED ORDER — IBUPROFEN 600 MG PO TABS
600.0000 mg | ORAL_TABLET | Freq: Four times a day (QID) | ORAL | Status: DC
  Administered 2011-10-27 – 2011-10-29 (×8): 600 mg via ORAL
  Filled 2011-10-27 (×8): qty 1

## 2011-10-27 MED ORDER — ONDANSETRON HCL 4 MG/2ML IJ SOLN: 4.0000 mg | INTRAMUSCULAR | Status: DC | PRN

## 2011-10-27 MED ORDER — DIPHENHYDRAMINE HCL 50 MG/ML IJ SOLN: 12.5000 mg | INTRAMUSCULAR | Status: DC | PRN

## 2011-10-27 MED ORDER — EPHEDRINE 5 MG/ML INJ: 10.0000 mg | INTRAVENOUS | Status: DC | PRN

## 2011-10-27 MED ORDER — OXYCODONE-ACETAMINOPHEN 5-325 MG PO TABS
1.0000 | ORAL_TABLET | ORAL | Status: DC | PRN
  Administered 2011-10-28: 0.5 via ORAL
  Filled 2011-10-27 (×2): qty 1

## 2011-10-27 MED ORDER — FENTANYL 2.5 MCG/ML BUPIVACAINE 1/10 % EPIDURAL INFUSION (WH - ANES): 14.0000 mL/h | INTRAMUSCULAR | Status: DC

## 2011-10-27 MED ORDER — IBUPROFEN 600 MG PO TABS: 600.0000 mg | ORAL_TABLET | Freq: Four times a day (QID) | ORAL | Status: DC | PRN

## 2011-10-27 MED ORDER — NALBUPHINE SYRINGE 5 MG/0.5 ML
5.0000 mg | INJECTION | INTRAMUSCULAR | Status: DC | PRN
  Filled 2011-10-27 (×2): qty 1

## 2011-10-27 MED ORDER — DIPHENHYDRAMINE HCL 25 MG PO CAPS: 25.0000 mg | ORAL_CAPSULE | Freq: Four times a day (QID) | ORAL | Status: DC | PRN

## 2011-10-27 MED ORDER — CEFAZOLIN SODIUM 1-5 GM-% IV SOLN
1.0000 g | Freq: Three times a day (TID) | INTRAVENOUS | Status: DC
  Filled 2011-10-27: qty 50

## 2011-10-27 MED ORDER — LANOLIN HYDROUS EX OINT: TOPICAL_OINTMENT | CUTANEOUS | Status: DC | PRN

## 2011-10-27 MED ORDER — OXYTOCIN 20 UNITS IN LACTATED RINGERS INFUSION - SIMPLE: 125.0000 mL/h | Freq: Once | INTRAVENOUS | Status: DC

## 2011-10-27 MED ORDER — HYDROXYZINE HCL 50 MG PO TABS: 50.0000 mg | ORAL_TABLET | Freq: Four times a day (QID) | ORAL | Status: DC | PRN

## 2011-10-27 MED ORDER — EPHEDRINE 5 MG/ML INJ
INTRAVENOUS | Status: AC
  Filled 2011-10-27: qty 4

## 2011-10-27 MED ORDER — HYDROXYZINE HCL 50 MG/ML IM SOLN: 50.0000 mg | Freq: Four times a day (QID) | INTRAMUSCULAR | Status: DC | PRN

## 2011-10-27 MED ORDER — SENNOSIDES-DOCUSATE SODIUM 8.6-50 MG PO TABS
2.0000 | ORAL_TABLET | Freq: Every day | ORAL | Status: DC
  Administered 2011-10-27 – 2011-10-28 (×2): 2 via ORAL

## 2011-10-27 MED ORDER — LACTATED RINGERS IV SOLN: INTRAVENOUS | Status: DC

## 2011-10-27 MED ORDER — OXYTOCIN BOLUS FROM INFUSION
500.0000 mL | Freq: Once | INTRAVENOUS | Status: DC
  Filled 2011-10-27: qty 500
  Filled 2011-10-27: qty 1000

## 2011-10-27 MED ORDER — LIDOCAINE HCL (PF) 1 % IJ SOLN
30.0000 mL | INTRAMUSCULAR | Status: DC | PRN
  Filled 2011-10-27: qty 30

## 2011-10-27 MED ORDER — FENTANYL 2.5 MCG/ML BUPIVACAINE 1/10 % EPIDURAL INFUSION (WH - ANES)
INTRAMUSCULAR | Status: DC | PRN
  Administered 2011-10-27: 14 mL/h via EPIDURAL

## 2011-10-27 MED ORDER — FLEET ENEMA 7-19 GM/118ML RE ENEM: 1.0000 | ENEMA | RECTAL | Status: DC | PRN

## 2011-10-27 MED ORDER — CEFAZOLIN SODIUM-DEXTROSE 2-3 GM-% IV SOLR
2.0000 g | Freq: Once | INTRAVENOUS | Status: AC
  Administered 2011-10-27: 2 g via INTRAVENOUS
  Filled 2011-10-27: qty 50

## 2011-10-27 MED ORDER — CITRIC ACID-SODIUM CITRATE 334-500 MG/5ML PO SOLN: 30.0000 mL | ORAL | Status: DC | PRN

## 2011-10-27 MED ORDER — DIBUCAINE 1 % RE OINT: 1.0000 "application " | TOPICAL_OINTMENT | RECTAL | Status: DC | PRN

## 2011-10-27 MED ORDER — OXYCODONE-ACETAMINOPHEN 5-325 MG PO TABS: 1.0000 | ORAL_TABLET | ORAL | Status: DC | PRN

## 2011-10-27 MED ORDER — ONDANSETRON HCL 4 MG PO TABS: 4.0000 mg | ORAL_TABLET | ORAL | Status: DC | PRN

## 2011-10-27 MED ORDER — BENZOCAINE-MENTHOL 20-0.5 % EX AERO: 1.0000 "application " | INHALATION_SPRAY | CUTANEOUS | Status: DC | PRN

## 2011-10-27 MED ORDER — LACTATED RINGERS IV SOLN: 500.0000 mL | Freq: Once | INTRAVENOUS | Status: DC

## 2011-10-27 MED ORDER — LIDOCAINE HCL (PF) 1 % IJ SOLN
INTRAMUSCULAR | Status: DC | PRN
  Administered 2011-10-27 (×2): 4 mL

## 2011-10-27 MED ORDER — TETANUS-DIPHTH-ACELL PERTUSSIS 5-2.5-18.5 LF-MCG/0.5 IM SUSP: 0.5000 mL | Freq: Once | INTRAMUSCULAR | Status: DC

## 2011-10-27 MED ORDER — ZOLPIDEM TARTRATE 5 MG PO TABS: 5.0000 mg | ORAL_TABLET | Freq: Every evening | ORAL | Status: DC | PRN

## 2011-10-27 MED ORDER — ONDANSETRON HCL 4 MG/2ML IJ SOLN: 4.0000 mg | Freq: Four times a day (QID) | INTRAMUSCULAR | Status: DC | PRN

## 2011-10-27 MED ORDER — FENTANYL 2.5 MCG/ML BUPIVACAINE 1/10 % EPIDURAL INFUSION (WH - ANES)
INTRAMUSCULAR | Status: AC
  Filled 2011-10-27: qty 60

## 2011-10-27 MED ORDER — LACTATED RINGERS IV SOLN
500.0000 mL | INTRAVENOUS | Status: DC | PRN
  Administered 2011-10-27: 500 mL via INTRAVENOUS

## 2011-10-27 MED ORDER — SIMETHICONE 80 MG PO CHEW: 80.0000 mg | CHEWABLE_TABLET | ORAL | Status: DC | PRN

## 2011-10-27 MED ORDER — ACETAMINOPHEN 325 MG PO TABS: 650.0000 mg | ORAL_TABLET | ORAL | Status: DC | PRN

## 2011-10-27 NOTE — Progress Notes (Signed)
Pt states having uc's for last our and they seem regular, but has not timed them.  Rates pain with uc's a 9. Denies leaking of fluid, states she lost her mucous plug yesterday.Marland Kitchen

## 2011-10-27 NOTE — H&P (Signed)
Kimberly Bennett is a 31 y.o. year old G69P3114 female at [redacted]w[redacted]d weeks gestation who presents to MAU reporting Labor. She reports pos Fm and She denies vaginal bleeding or leaking of fluid. She has [facility]NC [facility] the [facility].    Maternal Medical History:  Reason for admission: Reason for admission: contractions.  Contractions: Frequency: regular.   Perceived severity is strong.    Fetal activity: Perceived fetal activity is normal.   Last perceived fetal movement was within the past hour.    Prenatal Complications - Diabetes: none.    Patient Active Problem List  Diagnoses  . Normal IUP (intrauterine pregnancy) on prenatal ultrasound  . Bacterial vaginal infection  PCN allergy-Hives  OB History    Grav Para Term Preterm Abortions TAB SAB Ect Mult Living   5 4 3 1 1  1   4      Past Medical History  Diagnosis Date  . Urinary tract infection   . Chlamydia   . Trichomonas   . Abnormal Pap smear of cervix     colposcopy 2012, Jan  . Pregnancy induced hypertension    Past Surgical History  Procedure Date  . Colposcopy   . No past surgeries    Family History: family history is not on file. Social History:  reports that she has never smoked. She has never used smokeless tobacco. She reports that she does not drink alcohol or use illicit drugs.  Review of Systems  Constitutional: Negative for fever and chills.  Eyes: Negative for blurred vision.  Neurological: Negative for headaches.    Dilation: 6.5 Effacement (%): 100 Station: -1 Exam by:: RN Patient Vitals for the past 24 hrs:   Maternal Exam:  Uterine Assessment: Contraction strength is firm.  Contraction frequency is regular.   Abdomen: Fetal presentation: vertex  Introitus: Normal vulva. Normal vagina.  Cervix: Cervix evaluated by digital exam.   RN exam  Fetal Exam Fetal Monitor Review: Mode: ultrasound.   Baseline rate: 140.  Variability: moderate (6-25 bpm).   Pattern: accelerations present and no  decelerations.    Fetal State Assessment: Category I - tracings are normal.     10/27/11 0235 144/89 mmHg - - 88  22  - - -  10/27/11 0219 128/89 mmHg 98 F (36.7 C) Oral 89  20  - 5\' 5"  (1.651 m) 76.261 kg (168 lb 2 oz)    Physical Exam  Nursing note and vitals reviewed. Constitutional: She is oriented to person, place, and time. She appears well-developed and well-nourished. She appears distressed.  HENT:  Head: Normocephalic.  Cardiovascular: Normal rate.   Respiratory: Effort normal.  GI: Soft. There is no tenderness.  Neurological: She is alert and oriented to person, place, and time. She has normal reflexes.  Skin: Skin is warm and dry.  Psychiatric: She has a normal mood and affect.    Prenatal labs: ABO, Rh: --/--/O POS (07/12 2155) Antibody: Negative (08/15 0000) Rubella: Immune (08/14 0000) RPR: NON REACTIVE (03/05 0400)  HBsAg: Negative (08/14 0000)  HIV: Non-reactive (08/14 0000)  GBS: Positive (08/14 0000)  1 hour GTT 119 AFP neg  Assessment: 1. Labor: active  2. Fetal Wellbeing: Category I  3. Pain Control: Requesting epidural 4. GBS: pos 5. 38.2 week IUP 6. PNC allergy  Plan:  1. Admit to BS per consult with MD 2. Routine L&D orders 3. Analgesia/anesthesia PRN  4. Ancef for GBS  Kimberly Bennett 10/27/2011, 10:04 AM

## 2011-10-27 NOTE — Progress Notes (Signed)
UR Chart review completed.  

## 2011-10-27 NOTE — Progress Notes (Signed)
Pt off the monitor to ambulate.

## 2011-10-27 NOTE — Anesthesia Procedure Notes (Signed)
Epidural Patient location during procedure: OB Start time: 10/27/2011 5:26 AM  Staffing Anesthesiologist: Sanaiya Welliver A. Performed by: anesthesiologist   Preanesthetic Checklist Completed: patient identified, site marked, surgical consent, pre-op evaluation, timeout performed, IV checked, risks and benefits discussed and monitors and equipment checked  Epidural Patient position: sitting Prep: site prepped and draped and DuraPrep Patient monitoring: continuous pulse ox and blood pressure Approach: midline Injection technique: LOR air  Needle:  Needle type: Tuohy  Needle gauge: 17 G Needle length: 9 cm Needle insertion depth: 5 cm cm Catheter type: closed end flexible Catheter size: 19 Gauge Catheter at skin depth: 10 cm Test dose: negative and Other  Assessment Events: blood not aspirated, injection not painful, no injection resistance, negative IV test and no paresthesia  Additional Notes Patient identified. Risks and benefits discussed including failed block, incomplete  Pain control, post dural puncture headache, nerve damage, paralysis, blood pressure Changes, nausea, vomiting, reactions to medications-both toxic and allergic and post Partum back pain. All questions were answered. Patient expressed understanding and wished to proceed. Sterile technique was used throughout procedure. Epidural site was Dressed with sterile barrier dressing. No paresthesias, signs of intravascular injection Or signs of intrathecal spread were encountered.  Patient was more comfortable after the epidural was dosed. Please see RN's note for documentation of vital signs and FHR which are stable.

## 2011-10-27 NOTE — Anesthesia Postprocedure Evaluation (Signed)
  Anesthesia Post-op Note  Patient: Kimberly Bennett  Procedure(s) Performed: * No procedures listed *  Patient Location: PACU and Women's Unit  Anesthesia Type: Epidural  Level of Consciousness: awake, alert  and oriented  Airway and Oxygen Therapy: Patient Spontanous Breathing  Post-op Pain: none  Post-op Assessment: Post-op Vital signs reviewed, Patient's Cardiovascular Status Stable, No headache, No backache, No residual numbness and No residual motor weakness  Post-op Vital Signs: Reviewed and stable  Complications: No apparent anesthesia complications

## 2011-10-27 NOTE — Anesthesia Preprocedure Evaluation (Signed)
Anesthesia Evaluation  Patient identified by MRN, date of birth, ID band Patient awake    Reviewed: Allergy & Precautions, H&P , Patient's Chart, lab work & pertinent test results  Airway Mallampati: III TM Distance: >3 FB Neck ROM: Full    Dental No notable dental hx. (+) Teeth Intact   Pulmonary neg pulmonary ROS,  breath sounds clear to auscultation  Pulmonary exam normal       Cardiovascular negative cardio ROS  Rhythm:Regular Rate:Normal     Neuro/Psych negative neurological ROS  negative psych ROS   GI/Hepatic negative GI ROS, Neg liver ROS,   Endo/Other  negative endocrine ROS  Renal/GU negative Renal ROS  negative genitourinary   Musculoskeletal negative musculoskeletal ROS (+)   Abdominal   Peds  Hematology negative hematology ROS (+)   Anesthesia Other Findings   Reproductive/Obstetrics (+) Pregnancy                           Anesthesia Physical Anesthesia Plan  ASA: II  Anesthesia Plan: Epidural   Post-op Pain Management:    Induction:   Airway Management Planned:   Additional Equipment:   Intra-op Plan:   Post-operative Plan:   Informed Consent: I have reviewed the patients History and Physical, chart, labs and discussed the procedure including the risks, benefits and alternatives for the proposed anesthesia with the patient or authorized representative who has indicated his/her understanding and acceptance.     Plan Discussed with: CRNA, Anesthesiologist and Surgeon  Anesthesia Plan Comments:         Anesthesia Quick Evaluation

## 2011-10-27 NOTE — Progress Notes (Signed)
SAYS HURT BAD   AT .     VE TODAY 1 CM

## 2011-10-28 NOTE — Progress Notes (Signed)
Post Partum Day 1 Subjective: up ad lib and voiding Tolerating liquids, has not tried solids. Abdominal pain/cramps continue. Did not try percocet yesterday. Bleeding unchanged from yesterday.  Objective: Blood pressure 128/80, pulse 80, temperature 98.5 F (36.9 C), temperature source Oral, resp. rate 20, height 5\' 5"  (1.651 m), weight 76.261 kg (168 lb 2 oz), last menstrual period 02/01/2011, SpO2 98.00%, unknown if currently breastfeeding.  Physical Exam:  General: alert, cooperative and no distress Lochia: appropriate Uterine Fundus: firm Incision: n/a DVT Evaluation: No evidence of DVT seen on physical exam. Negative Homan's sign.   Basename 10/27/11 0400  HGB 9.9*  HCT 32.2*    Assessment/Plan: Plan for discharge tomorrow, Breastfeeding and Contraception Depo Bottlefeeding today since breastfeeding painful   LOS: 1 day   Ala Dach 10/28/2011, 7:58 AM

## 2011-10-29 MED ORDER — ACETAMINOPHEN-CODEINE 300-30 MG PO TABS: 1.0000 | ORAL_TABLET | ORAL | Status: AC | PRN

## 2011-10-29 MED ORDER — IBUPROFEN 600 MG PO TABS: 600.0000 mg | ORAL_TABLET | Freq: Four times a day (QID) | ORAL | Status: AC

## 2011-10-29 NOTE — Discharge Summary (Signed)
Obstetric Discharge Summary Reason for Admission: onset of labor Prenatal Procedures: none Intrapartum Procedures: spontaneous vaginal delivery Postpartum Procedures: none Complications-Operative and Postpartum: none Hemoglobin  Date Value Range Status  10/27/2011 9.9* 12.0-15.0 (g/dL) Final     HCT  Date Value Range Status  10/27/2011 32.2* 36.0-46.0 (%) Final    Discharge Diagnoses: Term Pregnancy-delivered  Discharge Information: Date: 10/29/2011 Activity: unrestricted Diet: routine Medications: Ibuprofen Condition: stable Instructions: refer to practice specific booklet Discharge to: home   Newborn Data: Live born female  Birth Weight: 7 lb 14.6 oz (3589 g) APGAR: 9, 9  Home with mother. Discussed normal uterine cramping with breastfeeding with patient.  She consulted with Lactation yesterday and plans on bottle feeding only.  Patient will schedule circumcision for infant son after discharge.  Discussed procedure with patient.  Patient aware to follow up at HD in 6 weeks for Depo.  Mardene Speak 10/29/2011, 8:12 AM

## 2011-10-29 NOTE — Discharge Summary (Signed)
Pt was seen by me and I agree with note above. [facility]

## 2011-11-02 ENCOUNTER — Encounter (HOSPITAL_COMMUNITY): Payer: Self-pay | Admitting: *Deleted

## 2011-11-02 ENCOUNTER — Inpatient Hospital Stay (HOSPITAL_COMMUNITY)
Admission: AD | Admit: 2011-11-02 | Discharge: 2011-11-04 | DRG: 776 | Disposition: A | Discharge status: Home / Self Care | Payer: Medicaid Other | Source: Ambulatory Visit | Attending: Obstetrics & Gynecology | Admitting: Obstetrics & Gynecology

## 2011-11-02 DIAGNOSIS — IMO0002 Reserved for concepts with insufficient information to code with codable children: Principal | ICD-10-CM | POA: Diagnosis present

## 2011-11-02 LAB — PROTEIN / CREATININE RATIO, URINE: Creatinine, Urine: 47.53 mg/dL

## 2011-11-02 LAB — CBC
HCT: 36.5 % (ref 36.0–46.0)
Hemoglobin: 11.1 g/dL — ABNORMAL LOW (ref 12.0–15.0)
MCHC: 30.4 g/dL (ref 30.0–36.0)
MCV: 69.1 fL — ABNORMAL LOW (ref 78.0–100.0)

## 2011-11-02 LAB — COMPREHENSIVE METABOLIC PANEL
Alkaline Phosphatase: 113 U/L (ref 39–117)
BUN: 6 mg/dL (ref 6–23)
Creatinine, Ser: 0.53 mg/dL (ref 0.50–1.10)
GFR calc Af Amer: >90 mL/min (ref >90)
Glucose, Bld: 79 mg/dL (ref 70–99)
Potassium: 3.3 mEq/L — ABNORMAL LOW (ref 3.5–5.1)
Total Bilirubin: 0.5 mg/dL (ref 0.3–1.2)
Total Protein: 6.5 g/dL (ref 6.0–8.3)

## 2011-11-02 LAB — URINALYSIS, ROUTINE W REFLEX MICROSCOPIC
Ketones, ur: NEGATIVE mg/dL
Nitrite: NEGATIVE
Protein, ur: NEGATIVE mg/dL
pH: 6 (ref 5.0–8.0)

## 2011-11-02 LAB — URINE MICROSCOPIC-ADD ON

## 2011-11-02 LAB — MRSA PCR SCREENING: MRSA by PCR: NEGATIVE

## 2011-11-02 MED ORDER — CALCIUM CARBONATE ANTACID 500 MG PO CHEW: 2.0000 | CHEWABLE_TABLET | ORAL | Status: DC | PRN

## 2011-11-02 MED ORDER — ACETAMINOPHEN 325 MG PO TABS
650.0000 mg | ORAL_TABLET | ORAL | Status: DC | PRN
  Administered 2011-11-02: 650 mg via ORAL
  Filled 2011-11-02: qty 2

## 2011-11-02 MED ORDER — ZOLPIDEM TARTRATE 10 MG PO TABS
10.0000 mg | ORAL_TABLET | Freq: Every evening | ORAL | Status: DC | PRN
  Administered 2011-11-03: 10 mg via ORAL
  Filled 2011-11-02: qty 1

## 2011-11-02 MED ORDER — MAGNESIUM SULFATE 40 G IN LACTATED RINGERS - SIMPLE
2.0000 g/h | INTRAVENOUS | Status: DC
  Administered 2011-11-02: 2 g/h via INTRAVENOUS
  Filled 2011-11-02: qty 500

## 2011-11-02 MED ORDER — LACTATED RINGERS IV SOLN
INTRAVENOUS | Status: DC
  Administered 2011-11-02 – 2011-11-03 (×3): via INTRAVENOUS

## 2011-11-02 MED ORDER — MAGNESIUM SULFATE BOLUS VIA INFUSION
4.0000 g | Freq: Once | INTRAVENOUS | Status: AC
  Administered 2011-11-02: 4 g via INTRAVENOUS
  Filled 2011-11-02: qty 500

## 2011-11-02 MED ORDER — DOCUSATE SODIUM 100 MG PO CAPS
100.0000 mg | ORAL_CAPSULE | Freq: Every day | ORAL | Status: DC
  Administered 2011-11-02 – 2011-11-04 (×3): 100 mg via ORAL
  Filled 2011-11-02 (×3): qty 1

## 2011-11-02 NOTE — MAU Note (Signed)
Patient states she had a SVD on 3-5. Was seen by home health nurse and her BP was elevated. Sent to MAU for evaluation. Patient states she has had some swelling and had a bad headache last night, not as bad today.

## 2011-11-02 NOTE — H&P (Signed)
Agree with above note.  Kimberly Bennett H. 11/02/2011 8:20 PM

## 2011-11-02 NOTE — H&P (Signed)
History    Chief Complaint   Patient presents with   .  Hypertension    HPI  Kimberly Bennett is a 31 yo [id number] who presents 6 days postpartum with elevated blood pressures. A home health nurse noted her BPs to be in the 160s and referred her here. Patient reports a throbbing left sided 7/10 headache last night, worse than most of her headaches, which has since improved to a 2/10. No headache prior to this. No changes in vision. Mild ringing in left ear. No N/V/abdominal pain. She reports in retrospect that she had mild RUQ pain around delivery. No peripheral edema. BP during pregnancy were 130s. No history of hypertension. She delivered on 10/27/11 via SVD without complications. BP during hospitalization for delivery were for two days prior to delivery. Mildly elevated on day of delivery. [facility]   Past Medical History   Diagnosis  Date   .  Urinary tract infection    .  Chlamydia    .  Trichomonas    .  Abnormal Pap smear of cervix      colposcopy 2012, Jan   .  Pregnancy induced hypertension     Past Surgical History   Procedure  Date   .  Colposcopy    .  No past surgeries     History reviewed. No pertinent family history.  History   Substance Use Topics   .  Smoking status:  Never Smoker   .  Smokeless tobacco:  Never Used   .  Alcohol Use:  No    Allergies:  Allergies   Allergen  Reactions   .  Penicillins  Hives and Itching   .  Latex  Rash    Prescriptions prior to admission   Medication  Sig  Dispense  Refill   .  Acetaminophen-Codeine (TYLENOL/CODEINE #3) 300-30 MG per tablet  Take 1 tablet by mouth every 4 (four) hours as needed for pain.  30 tablet  0   .  ibuprofen (ADVIL,MOTRIN) 600 MG tablet  Take 1 tablet (600 mg total) by mouth every 6 (six) hours.  30 tablet  1   .  Prenatal Vit-Fe Fumarate-FA (PRENATAL MULTIVITAMIN) TABS  Take 1 tablet by mouth daily.      Review of Systems  Constitutional: Negative for fever,  chills and diaphoresis.  HENT: Positive for tinnitus. Negative for hearing loss and ear pain.  Eyes: Negative for blurred vision, double vision, photophobia and pain.  Respiratory: Negative for cough and shortness of breath.  Cardiovascular: Negative for chest pain and leg swelling.  Gastrointestinal: Negative for nausea, vomiting and abdominal pain.  Genitourinary: Negative for dysuria and urgency.  Skin: Negative for rash.  Neurological: Positive for headaches.   Physical Exam   Blood pressure 164/95, pulse 71, temperature 98.2 F (36.8 C), temperature source Oral, resp. rate 16, height 5\' 5"  (1.651 m), weight 69.037 kg (152 lb 3.2 oz), last menstrual period 02/01/2011, SpO2 99.00%, unknown if currently breastfeeding.  Physical Exam  Constitutional: She is oriented to person, place, and time. She appears well-developed and well-nourished. No distress.  HENT:  Head: Normocephalic and atraumatic.  Eyes: EOM are normal.  Neck: Normal range of motion.  Cardiovascular: Normal rate, regular rhythm and normal heart sounds.  Respiratory: Effort normal and breath sounds normal.  GI: Soft. Bowel sounds are normal. She exhibits distension (mild, postpartum). There is no tenderness. There is no rebound and no guarding.  Musculoskeletal: Normal  range of motion. She exhibits no edema.  Neurological: She is alert and oriented to person, place, and time. No cranial nerve deficit.  Skin: Skin is warm and dry. She is not diaphoretic. No erythema.  Psychiatric: She has a normal mood and affect. Her behavior is normal. Judgment and thought content normal.   Assessment and Plan   Kimberly Bennett is a 31 yo [phone] who presents 6 days postpartum with elevated blood pressures and headache   Preeclampsia  - given elevated BP, headache and recently delivered  - admit to ICU  - bolus 4mg  Mg, than 2mg /hr  - CBC, CMP, protein/creatinine ratio, urinalysis  - continue Mg for 24 hours  - observe blood  pressure afterwards   Ala Dach  11/02/2011, 6:57 PM

## 2011-11-02 NOTE — MAU Provider Note (Signed)
Medical Screening exam and patient care preformed by advanced practice provider.  Agree with the above management.  

## 2011-11-02 NOTE — MAU Provider Note (Signed)
History      Chief Complaint  Patient presents with  . Hypertension   HPI  Kimberly Bennett is a 31 yo [id number] who presents 6 days postpartum with elevated blood pressures. A home health nurse noted her BPs to be in the 160s and referred her here. Patient reports a throbbing left sided 7/10 headache last night, worse than most of her headaches, which has since improved to a 2/10. No headache prior to this. No changes in vision. Mild ringing in left ear. No N/V/abdominal pain. She reports in retrospect that she had mild RUQ pain around delivery. No peripheral edema. BP during pregnancy were 130s. No history of hypertension. She delivered on 10/27/11 via SVD without complications. BP during hospitalization for delivery were for two days prior to delivery. Mildly elevated on day of delivery.  [facility].    Past Medical History  Diagnosis Date  . Urinary tract infection   . Chlamydia   . Trichomonas   . Abnormal Pap smear of cervix     colposcopy 2012, Jan  . Pregnancy induced hypertension     Past Surgical History  Procedure Date  . Colposcopy   . No past surgeries     History reviewed. No pertinent family history.  History  Substance Use Topics  . Smoking status: Never Smoker   . Smokeless tobacco: Never Used  . Alcohol Use: No    Allergies:  Allergies  Allergen Reactions  . Penicillins Hives and Itching  . Latex Rash    Prescriptions prior to admission  Medication Sig Dispense Refill  . Acetaminophen-Codeine (TYLENOL/CODEINE #3) 300-30 MG per tablet Take 1 tablet by mouth every 4 (four) hours as needed for pain.  30 tablet  0  . ibuprofen (ADVIL,MOTRIN) 600 MG tablet Take 1 tablet (600 mg total) by mouth every 6 (six) hours.  30 tablet  1  . Prenatal Vit-Fe Fumarate-FA (PRENATAL MULTIVITAMIN) TABS Take 1 tablet by mouth daily.        Review of Systems  Constitutional: Negative for fever, chills and diaphoresis.  HENT:  Positive for tinnitus. Negative for hearing loss and ear pain.   Eyes: Negative for blurred vision, double vision, photophobia and pain.  Respiratory: Negative for cough and shortness of breath.   Cardiovascular: Negative for chest pain and leg swelling.  Gastrointestinal: Negative for nausea, vomiting and abdominal pain.  Genitourinary: Negative for dysuria and urgency.  Skin: Negative for rash.  Neurological: Positive for headaches.   Physical Exam   Blood pressure 164/95, pulse 71, temperature 98.2 F (36.8 C), temperature source Oral, resp. rate 16, height 5\' 5"  (1.651 m), weight 69.037 kg (152 lb 3.2 oz), last menstrual period 02/01/2011, SpO2 99.00%, unknown if currently breastfeeding.  Physical Exam  Constitutional: She is oriented to person, place, and time. She appears well-developed and well-nourished. No distress.  HENT:  Head: Normocephalic and atraumatic.  Eyes: EOM are normal.  Neck: Normal range of motion.  Cardiovascular: Normal rate, regular rhythm and normal heart sounds.   Respiratory: Effort normal and breath sounds normal.  GI: Soft. Bowel sounds are normal. She exhibits distension (mild, postpartum). There is no tenderness. There is no rebound and no guarding.  Musculoskeletal: Normal range of motion. She exhibits no edema.  Neurological: She is alert and oriented to person, place, and time. No cranial nerve deficit.  Skin: Skin is warm and dry. She is not diaphoretic. No erythema.  Psychiatric: She has a normal mood and  affect. Her behavior is normal. Judgment and thought content normal.    MAU Course  Procedures   Assessment and Plan  Kimberly Bennett is a 31 yo [phone] who presents 6 days postpartum with elevated blood pressures and headache  Preeclampsia  - given elevated BP, headache and recently delivered  - admit to ICU  - bolus 4mg  Mg, than 2mg /hr  - CBC, CMP, protein/creatinine ratio, urinalysis  - continue Mg for 24 hours  - observe blood  pressure afterwards   Ala Dach 11/02/2011, 6:57 PM

## 2011-11-03 MED ORDER — HYDRALAZINE HCL 20 MG/ML IJ SOLN
10.0000 mg | INTRAMUSCULAR | Status: DC | PRN
  Administered 2011-11-03 (×2): 10 mg via INTRAVENOUS
  Filled 2011-11-03: qty 1

## 2011-11-03 MED ORDER — HYDROCHLOROTHIAZIDE 25 MG PO TABS
25.0000 mg | ORAL_TABLET | Freq: Every day | ORAL | Status: DC
  Administered 2011-11-03 – 2011-11-04 (×2): 25 mg via ORAL
  Filled 2011-11-03 (×2): qty 1

## 2011-11-03 MED ORDER — AMLODIPINE BESYLATE 5 MG PO TABS
5.0000 mg | ORAL_TABLET | Freq: Every day | ORAL | Status: DC
  Administered 2011-11-03 – 2011-11-04 (×2): 5 mg via ORAL
  Filled 2011-11-03 (×2): qty 1

## 2011-11-03 NOTE — Progress Notes (Signed)
UR chart review completed.  

## 2011-11-03 NOTE — Progress Notes (Signed)
New Admit - pt rec'd from MAU via wheelchair for Magnesium Sulfate therapy.  Hx SVD 3/5, female infant.  Seen by Home Health RNat home  today, inc BP.  Evaluated in MAU, adm to AICU as inpt. PP readmit.   IV LR infusing rt hand on admission.  Transferred easily to bed.  SR up x 2.  Oriented to room and plan of care discussed.

## 2011-11-04 DIAGNOSIS — IMO0002 Reserved for concepts with insufficient information to code with codable children: Principal | ICD-10-CM

## 2011-11-04 MED ORDER — AMLODIPINE BESYLATE 5 MG PO TABS: 5.0000 mg | ORAL_TABLET | Freq: Every day | ORAL | Status: DC

## 2011-11-04 MED ORDER — HYDROCHLOROTHIAZIDE 25 MG PO TABS: 25.0000 mg | ORAL_TABLET | Freq: Every day | ORAL | Status: AC

## 2011-11-04 NOTE — Discharge Summary (Signed)
Physician Discharge Summary  Patient ID: Kimberly Bennett MRN: [id number] DOB/AGE: 01-17-1981 31 y.o.  Admit date: 11/02/2011 Discharge date: 11/04/2011  Admission Diagnoses:s/p vag del on 10/27/11; PP preeclampsia  Discharge Diagnoses: PP preeclampsia- s/p magnesium therapy Active Problems:  * No active hospital problems. *    Discharged Condition: good  Hospital Course: Pt was noted by the home health nurse to have elevated BPs and headache on the 6th postpartum day and was sent to [facility] for evaluation. Her course of delivery was essentially negative with some labile BPs only on the day of delivery.  It was determined during her evaluation in MAU that she would benefit from an admission for 24 hours of mag sulfate therapy. Her BPs at that time were in the 160/90s.  During her mag therapy she was started on HCTZ as well as rec'd a dose of IV apresoline. After the magnesium was stopped she was started on Norvasc to obtain better control of her BPs. She is breast and bottlefeeding her infant. She denies any pain/headache currently and is deemed to have received the full benefit of her hospital stay.  Consults: None  Significant Diagnostic Studies: labs:  CMP     Component Value Date/Time   NA 139 11/02/2011 1820   K 3.3* 11/02/2011 1820   CL 102 11/02/2011 1820   CO2 26 11/02/2011 1820   GLUCOSE 79 11/02/2011 1820   BUN 6 11/02/2011 1820   CREATININE 0.53 11/02/2011 1820   CALCIUM 8.4 11/02/2011 1820   PROT 6.5 11/02/2011 1820   ALBUMIN 2.8* 11/02/2011 1820   AST 32 11/02/2011 1820   ALT 51* 11/02/2011 1820   ALKPHOS 113 11/02/2011 1820   BILITOT 0.5 11/02/2011 1820   GFRNONAA >90 11/02/2011 1820   GFRAA >90 11/02/2011 1820    CBC    Component Value Date/Time   WBC 6.5 11/02/2011 1820   RBC 5.28* 11/02/2011 1820   HGB 11.1* 11/02/2011 1820   HCT 36.5 11/02/2011 1820   PLT 320 11/02/2011 1820   MCV 69.1* 11/02/2011 1820   MCH 21.0* 11/02/2011 1820   MCHC 30.4 11/02/2011 1820   RDW 16.7*  11/02/2011 1820   LYMPHSABS 0.6* 05/11/2010 1935   MONOABS 0.6 05/11/2010 1935   EOSABS 0.0 05/11/2010 1935   BASOSABS 0.0 05/11/2010 1935   Protein/cr ratio: 0.19  Treatments: magnesium sulfate x 24 hours  Discharge Exam: Blood pressure 145/88, pulse 82, temperature 98.8 F (37.1 C), temperature source Oral, resp. rate 18, height 5\' 5"  (1.651 m), weight 69.219 kg (152 lb 9.6 oz), SpO2 97.00%, unknown if currently breastfeeding. General appearance: alert, cooperative and no distress Resp: clear to auscultation bilaterally Extremities: extremities normal, atraumatic, no cyanosis or edema  Disposition: Home-Health Care Svc   Medication List  As of 11/04/2011  7:59 AM   TAKE these medications         Acetaminophen-Codeine 300-30 MG per tablet   Take 1 tablet by mouth every 4 (four) hours as needed for pain.      amLODipine 5 MG tablet   Commonly known as: NORVASC   Take 1 tablet (5 mg total) by mouth daily.      hydrochlorothiazide 25 MG tablet   Commonly known as: HYDRODIURIL   Take 1 tablet (25 mg total) by mouth daily.      ibuprofen 600 MG tablet   Commonly known as: ADVIL,MOTRIN   Take 1 tablet (600 mg total) by mouth every 6 (six) hours.      prenatal  multivitamin Tabs   Take 1 tablet by mouth daily.           Follow-up Information    Follow up with [facility]. (Will schedule a visit for 1-2 weeks )    Contact information:   [address] [place] [address]          Signed: Cam Hai 11/04/2011, 7:59 AM

## 2011-11-04 NOTE — Discharge Instructions (Signed)
Preeclampsia and Eclampsia  Preeclampsia is a condition of high blood pressure during pregnancy. It can happen at 20 weeks or later in pregnancy. If high blood pressure occurs in the second half of pregnancy with no other symptoms, it is called gestational hypertension and goes away after the baby is born. If any of the symptoms listed below develop with gestational hypertension, it is then called preeclampsia. Eclampsia (convulsions) may follow preeclampsia. This is one of the reasons for regular prenatal checkups. Early diagnosis and treatment are very important to prevent eclampsia.  CAUSES   There is no known cause of preeclampsia/eclampsia in pregnancy. There are several known conditions that may put the pregnant woman at risk, such as:   The first pregnancy.   Having preeclampsia in a past pregnancy.   Having lasting (chronic) high blood pressure.   Having multiples (twins, triplets).   Being age 31 or older.   African American ethnic background.   Having kidney disease or diabetes.   Medical conditions such as lupus or blood diseases.   Being overweight (obese).  SYMPTOMS    High blood pressure.   Headaches.   Sudden weight gain.   Swelling of hands, face, legs, and feet.   Protein in the urine.   Feeling sick to your stomach (nauseous) and throwing up (vomiting).   Vision problems (blurred or double vision).   Numbness in the face, arms, legs, and feet.   Dizziness.   Slurred speech.   Preeclampsia can cause growth retardation in the fetus.   Separation (abruption) of the placenta.   Not enough fluid in the amniotic sac (oligohydramnios).   Sensitivity to bright lights.   Belly (abdominal) pain.  DIAGNOSIS   If protein is found in the urine in the second half of pregnancy, this is considered preeclampsia. Other symptoms mentioned above may also be present.  TREATMENT   It is necessary to treat this.   Your caregiver may prescribe bed rest early in this condition. Plenty of rest and  salt restriction may be all that is needed.   Medicines may be necessary to lower blood pressure if the condition does not respond to more conservative measures.   In more severe cases, hospitalization may be needed:   For treatment of blood pressure.   To control fluid retention.   To monitor the baby to see if the condition is causing harm to the baby.   Hospitalization is the best way to treat the first sign of preeclampsia. This is so the mother and baby can be watched closely and blood tests can be done effectively and correctly.   If the condition becomes severe, it may be necessary to induce labor or to remove the infant by surgical means (cesarean section). The best cure for preeclampsia/eclampsia is to deliver the baby.  Preeclampsia and eclampsia involve risks to mother and infant. Your caregiver will discuss these risks with you. Together, you can work out the best possible approach to your problems. Make sure you keep your prenatal visits as scheduled. Not keeping appointments could result in a chronic or permanent injury, pain, disability to you, and death or injury to you or your unborn baby. If there is any problem keeping the appointment, you must call to reschedule.  HOME CARE INSTRUCTIONS    Keep your prenatal appointments and tests as scheduled.   Tell your caregiver if you have any of the above risk factors.   Get plenty of rest and sleep.   Eat a balanced   diet that is low in salt, and do not add salt to your food.   Avoid stressful situations.   Only take over-the-counter and prescriptions medicines for pain, discomfort, or fever as directed by your caregiver.  SEEK IMMEDIATE MEDICAL CARE IF:    You develop severe swelling anywhere in the body. This usually occurs in the legs.   You gain 5 lb/2.3 kg or more in a week.   You develop a severe headache, dizziness, problems with your vision, or confusion.   You have abdominal pain, nausea, or vomiting.   You have a seizure.   You  have trouble moving any part of your body, or you develop numbness or problems speaking.   You have bruising or abnormal bleeding from anywhere in the body.   You develop a stiff neck.   You pass out.  MAKE SURE YOU:    Understand these instructions.   Will watch your condition.   Will get help right away if you are not doing well or get worse.  Document Released: 08/07/2000 Document Revised: 07/30/2011 Document Reviewed: 03/23/2008  [employer] Patient Information 2012 [employer].

## 2011-11-13 IMAGING — US US RENAL
1 series · 14 of 25 positions shown · non-contrast
Comparison: None.

CLINICAL DATA: Hemoglobinuria.

RENAL/URINARY TRACT ULTRASOUND COMPLETE

[Series 1: us renal · 14 of 27 slices shown]
[im 1/27]
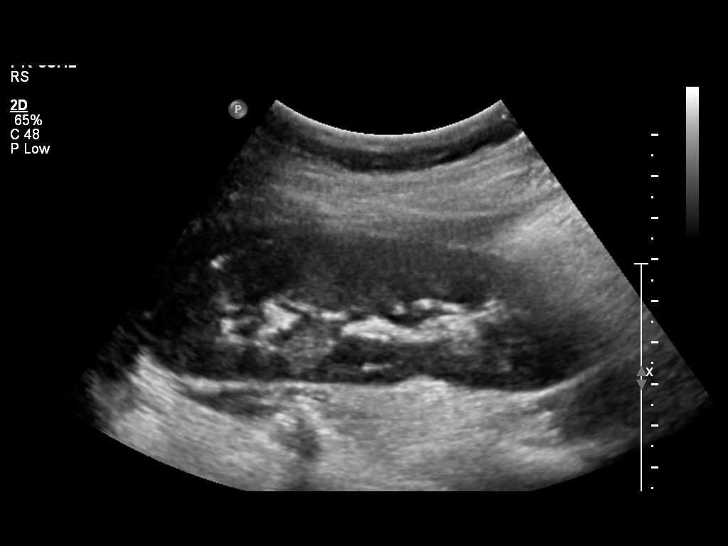
[im 3/27]
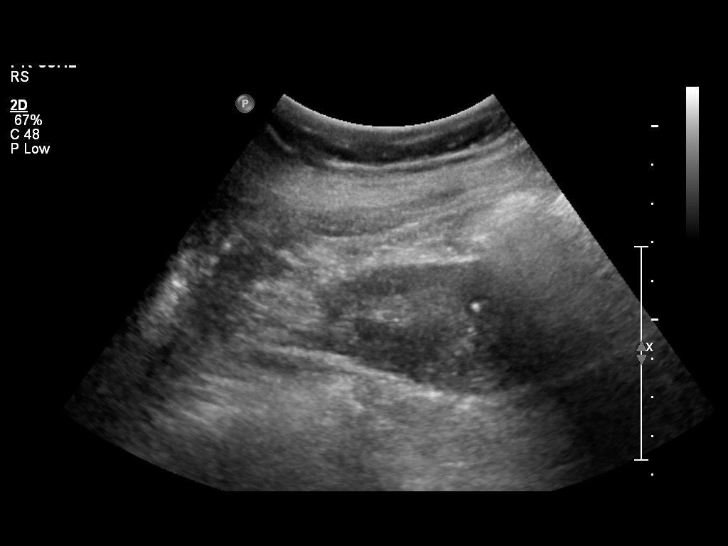
[im 5/27]
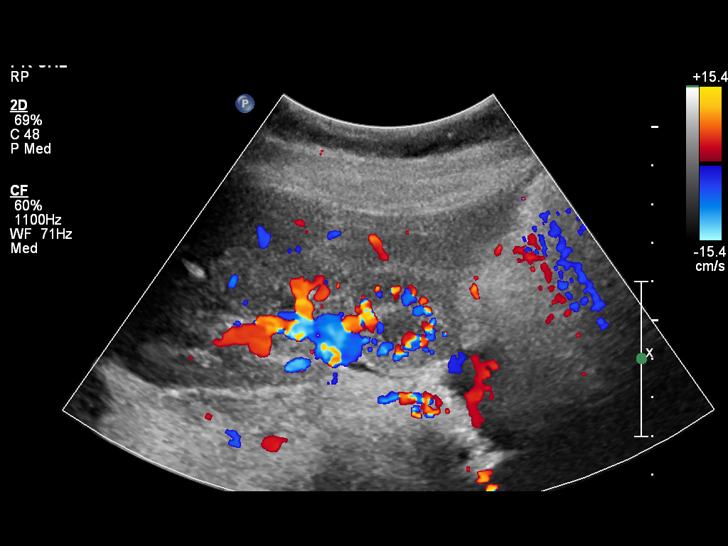
[im 7/27]
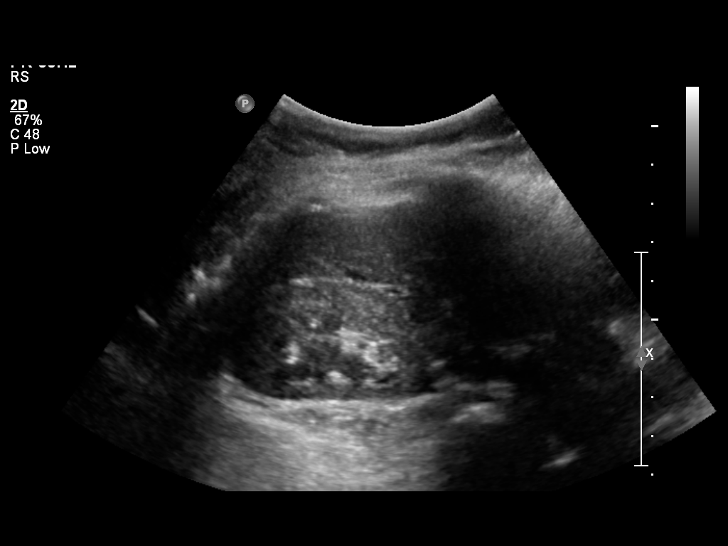
[im 9/27]
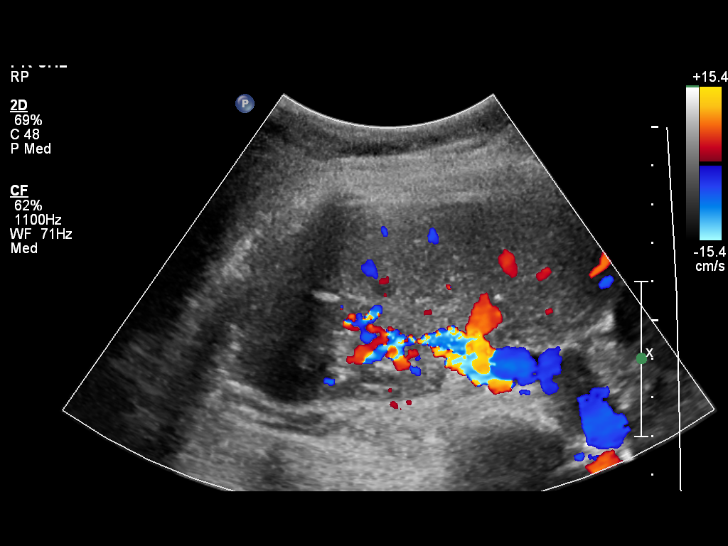
[im 10/27]
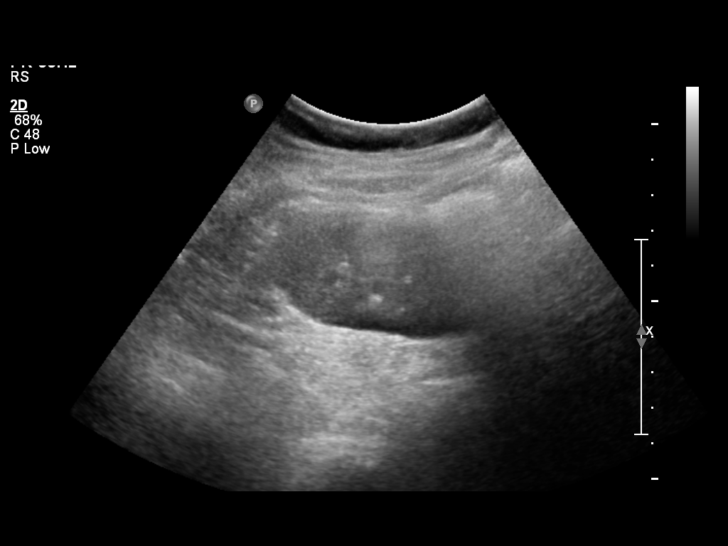
[im 12/27]
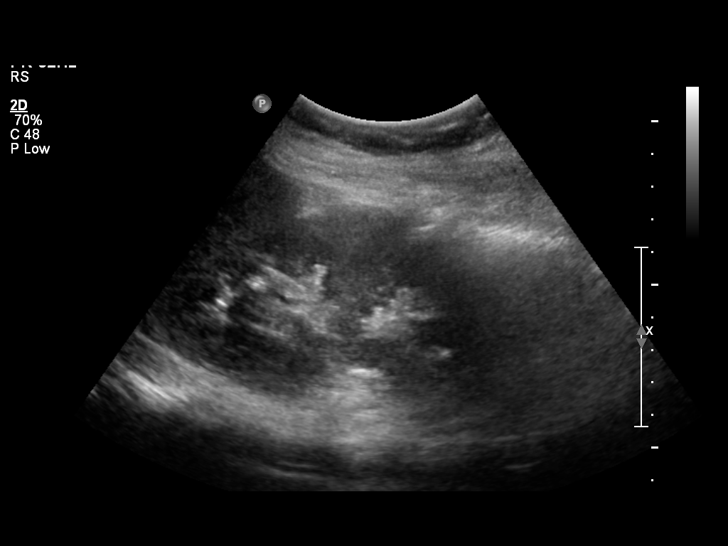
[im 15/27]
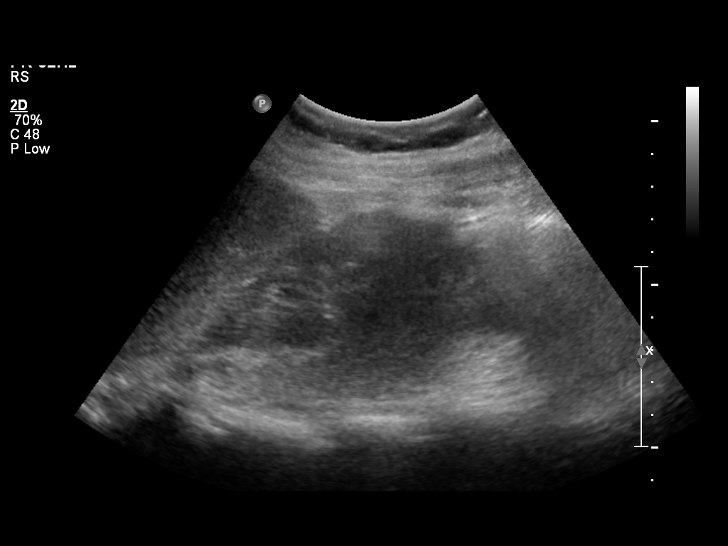
[im 17/27]
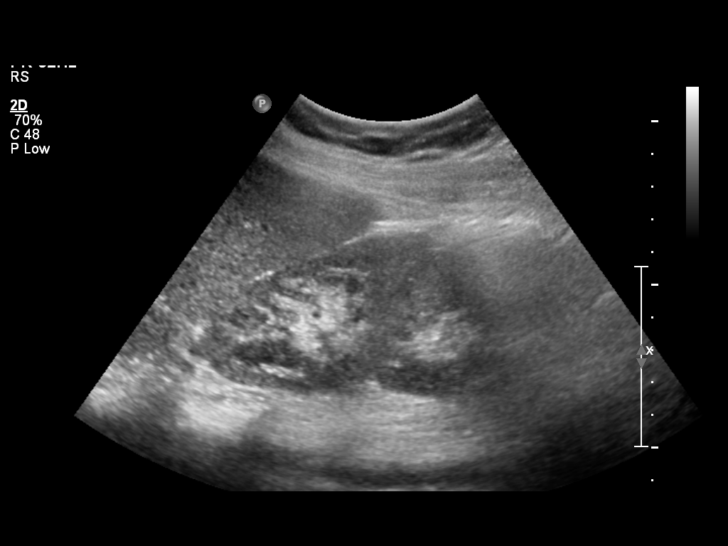
[im 18/27]
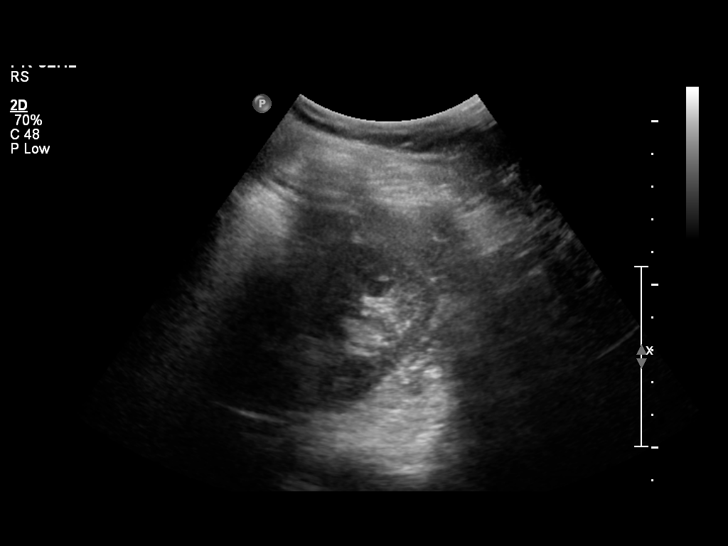
[im 20/27]
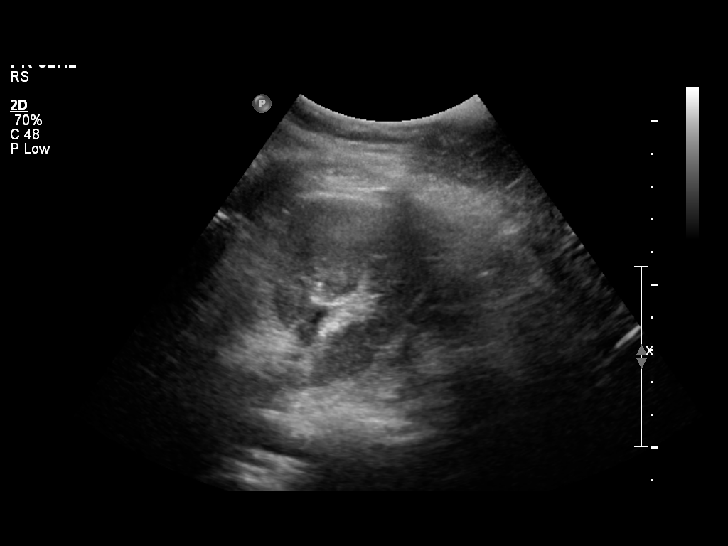
[im 22/27]
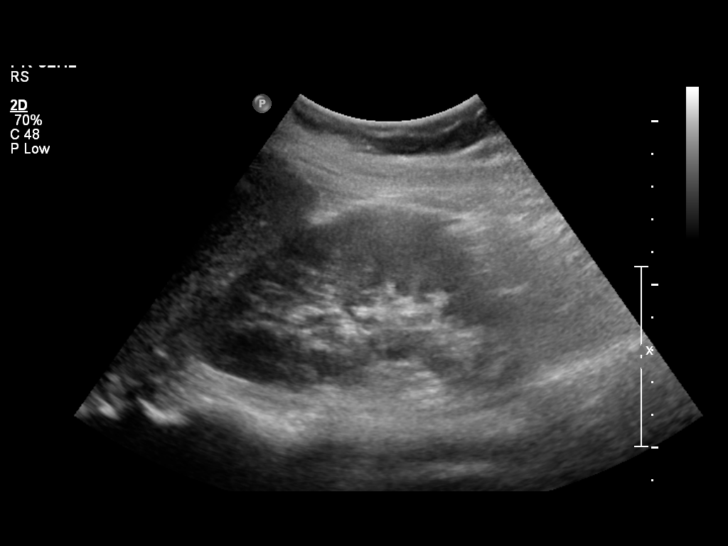
[im 24/27]
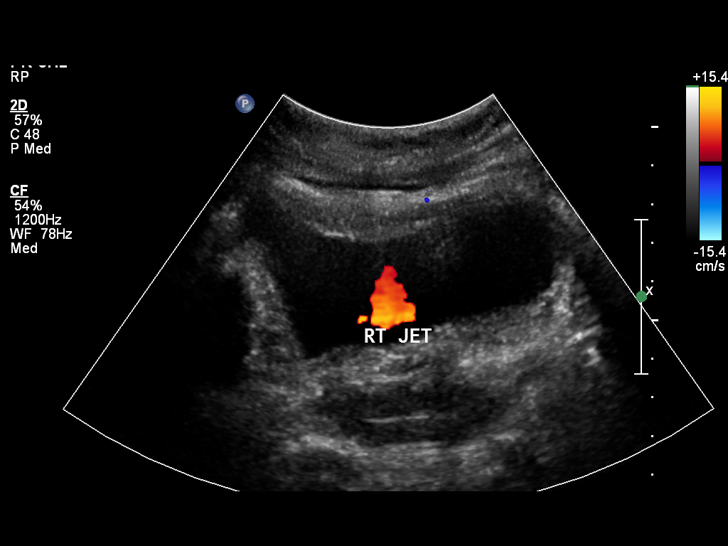
[im 27/27]
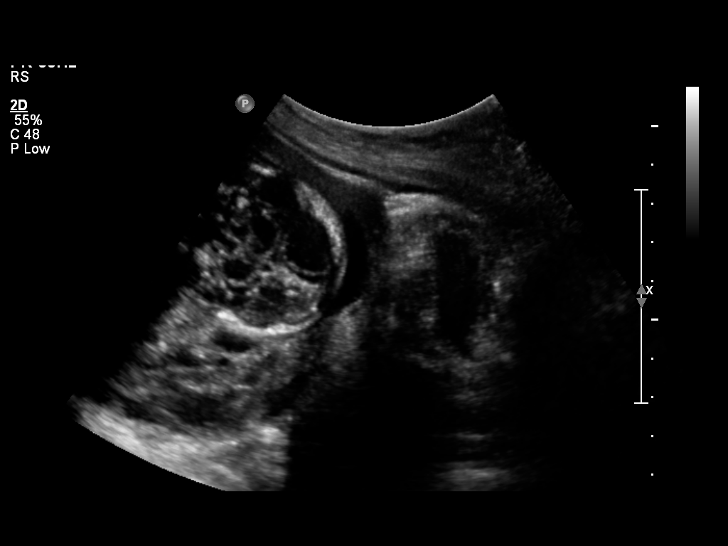

[14 of 25 positions shown; findings below may reference images not displayed]

FINDINGS: Right Kidney:  Normal.  11.2 cm in length.

Left Kidney:  Normal.  11.6 cm in length.

Bladder:  Bilateral ureteral jets are demonstrated in the normal
appearing bladder.
IMPRESSION: Normal bilateral renal ultrasound.

## 2011-11-18 ENCOUNTER — Ambulatory Visit: Payer: Medicaid Other | Admitting: *Deleted

## 2011-11-18 VITALS — BP 125/88 | HR 99 | Temp 97.6°F

## 2011-11-18 DIAGNOSIS — O165 Unspecified maternal hypertension, complicating the puerperium: Secondary | ICD-10-CM

## 2011-11-18 NOTE — Progress Notes (Signed)
Here for BP check. Discussed Bp and reviewed meds , history with Georges Mouse, CNM. Instructed patient to continue medicines and keep postpartum visit already scheduled- will decide then about meds. Patient voices understanding.

## 2011-12-09 ENCOUNTER — Ambulatory Visit: Payer: Medicaid Other | Admitting: Physician Assistant

## 2011-12-09 MED ORDER — AMLODIPINE BESYLATE 5 MG PO TABS: 5.0000 mg | ORAL_TABLET | Freq: Every day | ORAL | Status: DC

## 2011-12-09 NOTE — Progress Notes (Signed)
Pt came in today for a postpartum visit but told the Kimberly Bennett, front desk, that she already had a postpartum visit @ Kimberly Bennett and thought that she was here for her follow up of her BP meds.  I confirmed that she did have a postpartum visit with Kimberly Bennett but they did not address her BP issue.  Via Kimberly Bennett pt was advised to choose who she wants to be providers.  Pt choose to Kimberly Bennett.  Per Kimberly Bennett, pt will get a 2 weeks supply of Kimberly Bennett in which she would need to get BP check @ Kimberly Bennett.  From then Kimberly Bennett will be providers for taking care of her BP.  Pt stated understanding and had no further questions.

## 2011-12-15 ENCOUNTER — Telehealth: Payer: Self-pay | Admitting: *Deleted

## 2011-12-15 DIAGNOSIS — IMO0002 Reserved for concepts with insufficient information to code with codable children: Secondary | ICD-10-CM

## 2011-12-15 MED ORDER — AMLODIPINE BESYLATE 5 MG PO TABS: 5.0000 mg | ORAL_TABLET | Freq: Every day | ORAL | Status: DC

## 2011-12-15 NOTE — Telephone Encounter (Signed)
Pt left message stating that her pharmacy does not have the Rx for her BP med and wants to know if it was called in after her 4/17 visit. I reviewed the chart and observed that it was in print status. I sent the Rx as normal to pt pharmacy.  I notified pt that she can pick up her Rx later today. Pt voiced understanding.

## 2013-10-15 ENCOUNTER — Emergency Department (HOSPITAL_COMMUNITY): Payer: Medicaid Other

## 2013-10-15 ENCOUNTER — Encounter (HOSPITAL_COMMUNITY): Payer: Self-pay | Admitting: Emergency Medicine

## 2013-10-15 ENCOUNTER — Emergency Department (HOSPITAL_COMMUNITY)
Admission: EM | Admit: 2013-10-15 | Discharge: 2013-10-15 | Disposition: A | Discharge status: Home / Self Care | Payer: Medicaid Other | Attending: Emergency Medicine | Admitting: Emergency Medicine

## 2013-10-15 DIAGNOSIS — X500XXA Overexertion from strenuous movement or load, initial encounter: Secondary | ICD-10-CM | POA: Insufficient documentation

## 2013-10-15 DIAGNOSIS — Z880 Allergy status to penicillin: Secondary | ICD-10-CM | POA: Insufficient documentation

## 2013-10-15 DIAGNOSIS — Z87440 Personal history of urinary (tract) infections: Secondary | ICD-10-CM | POA: Insufficient documentation

## 2013-10-15 DIAGNOSIS — Z8619 Personal history of other infectious and parasitic diseases: Secondary | ICD-10-CM | POA: Insufficient documentation

## 2013-10-15 DIAGNOSIS — S93409A Sprain of unspecified ligament of unspecified ankle, initial encounter: Secondary | ICD-10-CM | POA: Insufficient documentation

## 2013-10-15 DIAGNOSIS — Y9301 Activity, walking, marching and hiking: Secondary | ICD-10-CM | POA: Insufficient documentation

## 2013-10-15 DIAGNOSIS — Z79899 Other long term (current) drug therapy: Secondary | ICD-10-CM | POA: Insufficient documentation

## 2013-10-15 DIAGNOSIS — Y929 Unspecified place or not applicable: Secondary | ICD-10-CM | POA: Insufficient documentation

## 2013-10-15 DIAGNOSIS — Z91040 Latex allergy status: Secondary | ICD-10-CM | POA: Insufficient documentation

## 2013-10-15 DIAGNOSIS — W010XXA Fall on same level from slipping, tripping and stumbling without subsequent striking against object, initial encounter: Secondary | ICD-10-CM | POA: Insufficient documentation

## 2013-10-15 MED ORDER — NAPROXEN 500 MG PO TABS
500.0000 mg | ORAL_TABLET | Freq: Two times a day (BID) | ORAL | Status: DC
Start: 2013-10-15 — End: 2013-12-09

## 2013-10-15 NOTE — ED Provider Notes (Signed)
CSN: [id number]     Arrival date & time 10/15/13  1107 History  This chart was scribed for non-physician practitioner, Antonietta Breach, PA-C working with Blanchard Kelch, MD by Frederich Balding, ED scribe. This patient was seen in room TR10C/TR10C and the patient's care was started at 11:49 AM.   Chief Complaint  Patient presents with  . Ankle Pain   The history is provided by the patient. No language interpreter was used.   HPI Comments: Kimberly Bennett is a 33 y.o. female who presents to the Emergency Department complaining of a fall that occurred yesterday. Pt slipped on ice while walking in heels and twisted her left ankle. She has sudden onset throbbing left ankle pain with associated swelling. Pt has not iced her ankle or taken any medication for the pain. Denies fever, paleness to foot, loss of sensation or numbness, weakness.   Past Medical History  Diagnosis Date  . Urinary tract infection   . Chlamydia   . Trichomonas   . Abnormal Pap smear of cervix     colposcopy 2012, Jan  . Pregnancy induced hypertension    Past Surgical History  Procedure Laterality Date  . Colposcopy    . No past surgeries     History reviewed. No pertinent family history. History  Substance Use Topics  . Smoking status: Never Smoker   . Smokeless tobacco: Never Used  . Alcohol Use: No   OB History   Grav Para Term Preterm Abortions TAB SAB Ect Mult Living   5 4 3 1 1  0 1 0 0 4     Review of Systems  Constitutional: Negative for fever.  Musculoskeletal: Positive for arthralgias and joint swelling.  Skin: Negative for pallor.  Neurological: Negative for weakness and numbness.  All other systems reviewed and are negative.   Allergies  Penicillins and Latex  Home Medications   Current Outpatient Rx  Name  Route  Sig  Dispense  Refill  . EXPIRED: amLODipine (NORVASC) 5 MG tablet   Oral   Take 1 tablet (5 mg total) by mouth daily.   30 tablet   0   . Prenatal Vit-Fe Fumarate-FA  (PRENATAL MULTIVITAMIN) TABS   Oral   Take 1 tablet by mouth daily.          BP 121/75  Pulse 91  Temp(Src) 98.7 F (37.1 C) (Oral)  Resp 20  SpO2 97%  Physical Exam  Nursing note and vitals reviewed. Constitutional: She is oriented to person, place, and time. She appears well-developed and well-nourished. No distress.  HENT:  Head: Normocephalic and atraumatic.  Eyes: Conjunctivae and EOM are normal. No scleral icterus.  Neck: Normal range of motion.  Cardiovascular: Normal rate, regular rhythm and intact distal pulses.   DP and PT pulses 2+ in LLE  Pulmonary/Chest: Effort normal. No respiratory distress.  Musculoskeletal: Normal range of motion.       Left ankle: She exhibits swelling (lateral ankle). She exhibits normal range of motion, no ecchymosis, no deformity and normal pulse. Tenderness. Lateral malleolus tenderness found. Achilles tendon normal.       Left lower leg: Normal.       Left foot: Normal.  Neurological: She is alert and oriented to person, place, and time. She has normal reflexes. She displays normal reflexes. She exhibits normal muscle tone.  No gross sensory deficits appreciated. Patient moves extremities without ataxia. DTRs normal and symmetric.  Skin: Skin is warm and dry. No rash noted. She is  not diaphoretic. No erythema. No pallor.  Psychiatric: She has a normal mood and affect. Her behavior is normal.    ED Course  Procedures (including critical care time)  DIAGNOSTIC STUDIES: Oxygen Saturation is 97% on RA, normal by my interpretation.    COORDINATION OF CARE: 11:50 AM-Discussed treatment plan which includes xray with pt at bedside and pt agreed to plan.   Labs Review Labs Reviewed - No data to display Imaging Review Dg Ankle Complete Left  10/15/2013   CLINICAL DATA:  Fall, lateral pain  EXAM: LEFT ANKLE COMPLETE - 3+ VIEW  COMPARISON:  None.  FINDINGS: There is no evidence of fracture, dislocation, or joint effusion. There is no evidence  of arthropathy or other focal bone abnormality. Soft tissues are unremarkable.  IMPRESSION: No acute osseous finding.   Electronically Signed   By: Daryll Brod M.D.   On: 10/15/2013 12:31    EKG Interpretation   None       MDM   Final diagnoses:  Ankle sprain    Uncomplicated ankle sprain secondary to a rolled ankle while walking on ice. Patient well and nontoxic appearing, hemodynamically stable, and afebrile. She is neurovascularly intact in physical exam. No gross sensory deficits appreciated. No evidence of septic joint. X-ray negative for fracture or dislocation. ASO ankle applied and crutches provided. Patient stable and appropriate for discharge with prescription for naproxen and RICE instructions. Return precautions provided and patient agreeable to plan with no unaddressed concerns.  I personally performed the services described in this documentation, which was scribed in my presence. The recorded information has been reviewed and is accurate.  Antonietta Breach, PA-C 10/15/13 1252

## 2013-10-15 NOTE — ED Notes (Signed)
Per pt sts she slipped on the ice yesterday and twisted her left ankle. sts started swelling last night.

## 2013-10-15 NOTE — ED Notes (Signed)
Patient transported to X-ray 

## 2013-10-15 NOTE — Discharge Instructions (Signed)
Ankle Sprain °An ankle sprain is an injury to the strong, fibrous tissues (ligaments) that hold the bones of your ankle joint together.  °CAUSES °An ankle sprain is usually caused by a fall or by twisting your ankle. Ankle sprains most commonly occur when you step on the outer edge of your foot, and your ankle turns inward. People who participate in sports are more prone to these types of injuries.  °SYMPTOMS  °· Pain in your ankle. The pain may be present at rest or only when you are trying to stand or walk. °· Swelling. °· Bruising. Bruising may develop immediately or within 1 to 2 days after your injury. °· Difficulty standing or walking, particularly when turning corners or changing directions. °DIAGNOSIS  °Your caregiver will ask you details about your injury and perform a physical exam of your ankle to determine if you have an ankle sprain. During the physical exam, your caregiver will press on and apply pressure to specific areas of your foot and ankle. Your caregiver will try to move your ankle in certain ways. An X-ray exam may be done to be sure a bone was not broken or a ligament did not separate from one of the bones in your ankle (avulsion fracture).  °TREATMENT  °Certain types of braces can help stabilize your ankle. Your caregiver can make a recommendation for this. Your caregiver may recommend the use of medicine for pain. If your sprain is severe, your caregiver may refer you to a surgeon who helps to restore function to parts of your skeletal system (orthopedist) or a physical therapist. °HOME CARE INSTRUCTIONS  °· Apply ice to your injury for 1 2 days or as directed by your caregiver. Applying ice helps to reduce inflammation and pain. °· Put ice in a plastic bag. °· Place a towel between your skin and the bag. °· Leave the ice on for 15-20 minutes at a time, every 2 hours while you are awake. °· Only take over-the-counter or prescription medicines for pain, discomfort, or fever as directed by  your caregiver. °· Elevate your injured ankle above the level of your heart as much as possible for 2 3 days. °· If your caregiver recommends crutches, use them as instructed. Gradually put weight on the affected ankle. Continue to use crutches or a cane until you can walk without feeling pain in your ankle. °· If you have a plaster splint, wear the splint as directed by your caregiver. Do not rest it on anything harder than a pillow for the first 24 hours. Do not put weight on it. Do not get it wet. You may take it off to take a shower or bath. °· You may have been given an elastic bandage to wear around your ankle to provide support. If the elastic bandage is too tight (you have numbness or tingling in your foot or your foot becomes cold and blue), adjust the bandage to make it comfortable. °· If you have an air splint, you may blow more air into it or let air out to make it more comfortable. You may take your splint off at night and before taking a shower or bath. Wiggle your toes in the splint several times per day to decrease swelling. °SEEK MEDICAL CARE IF:  °· You have rapidly increasing bruising or swelling. °· Your toes feel extremely cold or you lose feeling in your foot. °· Your pain is not relieved with medicine. °SEEK IMMEDIATE MEDICAL CARE IF: °· Your toes are numb   or blue. °· You have severe pain that is increasing. °MAKE SURE YOU:  °· Understand these instructions. °· Will watch your condition. °· Will get help right away if you are not doing well or get worse. °Document Released: 08/10/2005 Document Revised: 05/04/2012 Document Reviewed: 08/22/2011 °ExitCare® Patient Information ©2014 [employer]. °RICE: Routine Care for Injuries °The routine care of many injuries includes Rest, Ice, Compression, and Elevation (RICE). °HOME CARE INSTRUCTIONS °· Rest is needed to allow your body to heal. Routine activities can usually be resumed when comfortable. Injured tendons and bones can take up to 6 weeks to  heal. Tendons are the cord-like structures that attach muscle to bone. °· Ice following an injury helps keep the swelling down and reduces pain. °· Put ice in a plastic bag. °· Place a towel between your skin and the bag. °· Leave the ice on for 15-20 minutes, 03-04 times a day. Do this while awake, for the first 24 to 48 hours. After that, continue as directed by your caregiver. °· Compression helps keep swelling down. It also gives support and helps with discomfort. If an elastic bandage has been applied, it should be removed and reapplied every 3 to 4 hours. It should not be applied tightly, but firmly enough to keep swelling down. Watch fingers or toes for swelling, bluish discoloration, coldness, numbness, or excessive pain. If any of these problems occur, remove the bandage and reapply loosely. Contact your caregiver if these problems continue. °· Elevation helps reduce swelling and decreases pain. With extremities, such as the arms, hands, legs, and feet, the injured area should be placed near or above the level of the heart, if possible. °SEEK IMMEDIATE MEDICAL CARE IF: °· You have persistent pain and swelling. °· You develop redness, numbness, or unexpected weakness. °· Your symptoms are getting worse rather than improving after several days. °These symptoms may indicate that further evaluation or further X-rays are needed. Sometimes, X-rays may not show a small broken bone (fracture) until 1 week or 10 days later. Make a follow-up appointment with your caregiver. Ask when your X-ray results will be ready. Make sure you get your X-ray results. °Document Released: 11/22/2000 Document Revised: 11/02/2011 Document Reviewed: 01/09/2011 °ExitCare® Patient Information ©2014 [employer]. ° °

## 2013-10-16 NOTE — ED Provider Notes (Signed)
Medical screening examination/treatment/procedure(s) were performed by non-physician practitioner and as supervising physician I was immediately available for consultation/collaboration.  EKG Interpretation   None         Blanchard Kelch, MD 10/16/13 1243

## 2013-12-09 ENCOUNTER — Emergency Department (HOSPITAL_COMMUNITY)
Admission: EM | Admit: 2013-12-09 | Discharge: 2013-12-09 | Disposition: A | Discharge status: Home / Self Care | Payer: Medicaid Other | Attending: Emergency Medicine | Admitting: Emergency Medicine

## 2013-12-09 ENCOUNTER — Encounter (HOSPITAL_COMMUNITY): Payer: Self-pay | Admitting: Emergency Medicine

## 2013-12-09 DIAGNOSIS — B9689 Other specified bacterial agents as the cause of diseases classified elsewhere: Secondary | ICD-10-CM | POA: Insufficient documentation

## 2013-12-09 DIAGNOSIS — N898 Other specified noninflammatory disorders of vagina: Secondary | ICD-10-CM

## 2013-12-09 DIAGNOSIS — Z880 Allergy status to penicillin: Secondary | ICD-10-CM | POA: Insufficient documentation

## 2013-12-09 DIAGNOSIS — Z872 Personal history of diseases of the skin and subcutaneous tissue: Secondary | ICD-10-CM | POA: Insufficient documentation

## 2013-12-09 DIAGNOSIS — A499 Bacterial infection, unspecified: Secondary | ICD-10-CM | POA: Insufficient documentation

## 2013-12-09 DIAGNOSIS — N760 Acute vaginitis: Secondary | ICD-10-CM | POA: Insufficient documentation

## 2013-12-09 DIAGNOSIS — Z91040 Latex allergy status: Secondary | ICD-10-CM | POA: Insufficient documentation

## 2013-12-09 DIAGNOSIS — Z3202 Encounter for pregnancy test, result negative: Secondary | ICD-10-CM | POA: Insufficient documentation

## 2013-12-09 LAB — WET PREP, GENITAL
Trich, Wet Prep: NONE SEEN
Yeast Wet Prep HPF POC: NONE SEEN

## 2013-12-09 LAB — URINALYSIS, ROUTINE W REFLEX MICROSCOPIC
Bilirubin Urine: NEGATIVE
GLUCOSE, UA: NEGATIVE mg/dL
Hgb urine dipstick: NEGATIVE
KETONES UR: NEGATIVE mg/dL
Leukocytes, UA: NEGATIVE
NITRITE: NEGATIVE
PROTEIN: NEGATIVE mg/dL
Specific Gravity, Urine: 1.024 (ref 1.005–1.030)
UROBILINOGEN UA: 1 mg/dL (ref 0.0–1.0)
pH: 8 (ref 5.0–8.0)

## 2013-12-09 LAB — POC URINE PREG, ED: Preg Test, Ur: NEGATIVE

## 2013-12-09 LAB — RPR

## 2013-12-09 MED ORDER — LIDOCAINE HCL (PF) 1 % IJ SOLN
INTRAMUSCULAR | Status: AC
  Administered 2013-12-09: 5 mL
  Filled 2013-12-09: qty 5

## 2013-12-09 MED ORDER — CEFTRIAXONE SODIUM 250 MG IJ SOLR
250.0000 mg | Freq: Once | INTRAMUSCULAR | Status: AC
  Administered 2013-12-09: 250 mg via INTRAMUSCULAR
  Filled 2013-12-09: qty 250

## 2013-12-09 MED ORDER — METRONIDAZOLE 0.75 % VA GEL: 1.0000 | Freq: Two times a day (BID) | VAGINAL | Status: DC

## 2013-12-09 MED ORDER — AZITHROMYCIN 250 MG PO TABS
1000.0000 mg | ORAL_TABLET | Freq: Once | ORAL | Status: AC
  Administered 2013-12-09: 1000 mg via ORAL
  Filled 2013-12-09: qty 4

## 2013-12-09 NOTE — Discharge Instructions (Signed)
Please read and follow all provided instructions.  Your diagnoses today include:  1. Vaginal discharge   2. Bacterial vaginosis     Tests performed today include:  Test for gonorrhea and chlamydia. You will be notified by telephone if you have a positive result.  Test for HIV and syphilis - You will be notified by telephone if you have a positive result.  Wet prep - shows mild bacterial vaginosis  Vital signs. See below for your results today.   Medications:  You were treated for chlamydia (1 gram azithromycin pills) and gonorrhea (250mg  rocephin shot).   Metronidazole - antibiotic  You have been prescribed an antibiotic medicine: take the entire course of medicine even if you are feeling better. Stopping early can cause the antibiotic not to work.  Home care instructions:  Read educational materials contained in this packet and follow any instructions provided.   You should tell your partners about your infection and avoid having sex for one week to allow time for the medicine to work.  Follow-up instructions: You should follow-up with the [facility] clinic.  STD Testing:  [place]  [address], [place], phone [phone] or [phone]    Monday - Friday, call for an appointment  [place]  [address], [place], phone [phone] or [phone]   Monday - Friday, call for an appointment   If you do not have a primary care doctor -- see below for referral information.   Return instructions:   Please return to the Emergency Department if you experience worsening symptoms.   Please return if you have any other emergent concerns.  Additional Information:  Your vital signs today were: BP 121/88   Pulse 110   Temp(Src) 99.4 F (37.4 C) (Oral)   Resp 16   Ht 5\' 4"  (1.626 m)   Wt 141 lb 11.2 oz (64.275 kg)   BMI 24.31 kg/m2    SpO2 100%   LMP 11/24/2013 If your blood pressure (BP) was elevated above 135/85 this visit, please have this repeated by your doctor within one month. --------------

## 2013-12-09 NOTE — ED Notes (Signed)
Phlebotomy at bedside.

## 2013-12-09 NOTE — ED Notes (Signed)
Per pt sts a few days of lower abdominal pain with vaginal discharge. Denies urinary symptoms. LMP on the 3rd. Denies N,V,D.

## 2013-12-09 NOTE — ED Provider Notes (Signed)
CSN: [id number]     Arrival date & time 12/09/13  7672 History   First MD Initiated Contact with Patient 12/09/13 1108     Chief Complaint  Patient presents with  . Abdominal Pain     (Consider location/radiation/quality/duration/timing/severity/associated sxs/prior Treatment) HPI Comments: Patient presents with complaint of intermittent, bilateral, lower, cramping pain with associated vaginal discharge that is white in color for the past 3 days. Patient has a history of sexual transmitted disease and she is concerned that she has an STD today. Pain does not radiate. It is not associated with fever, nausea, vomiting, or diarrhea. She does not report dysuria or hematuria. Patient is sexually active and uses condoms only occasionally. She has been taking ibuprofen and Tylenol without relief of pain. Onset of symptoms gradual. Course is constant. Nothing makes symptoms better or worse. Patient's last period was 11/24/13 was normal for her.  The history is provided by the patient.    Past Medical History  Diagnosis Date  . Urinary tract infection   . Chlamydia   . Trichomonas   . Abnormal Pap smear of cervix     colposcopy 2012, Jan  . Pregnancy induced hypertension    Past Surgical History  Procedure Laterality Date  . Colposcopy    . No past surgeries     History reviewed. No pertinent family history. History  Substance Use Topics  . Smoking status: Never Smoker   . Smokeless tobacco: Never Used  . Alcohol Use: No   OB History   Grav Para Term Preterm Abortions TAB SAB Ect Mult Living   5 4 3 1 1  0 1 0 0 4     Review of Systems  Constitutional: Negative for fever.  HENT: Negative for rhinorrhea and sore throat.   Eyes: Negative for redness.  Respiratory: Negative for cough.   Cardiovascular: Negative for chest pain.  Gastrointestinal: Positive for abdominal pain. Negative for nausea, vomiting, diarrhea, constipation and blood in stool.  Genitourinary: Positive for  vaginal discharge. Negative for dysuria.  Musculoskeletal: Negative for myalgias.  Skin: Negative for rash.  Neurological: Negative for headaches.      Allergies  Penicillins and Latex  Home Medications   Prior to Admission medications   Not on File   BP 130/78  Pulse 87  Temp(Src) 99.4 F (37.4 C) (Oral)  Resp 16  Ht 5\' 4"  (1.626 m)  Wt 141 lb 11.2 oz (64.275 kg)  BMI 24.31 kg/m2  SpO2 98%  LMP 11/24/2013  Physical Exam  Nursing note and vitals reviewed. Constitutional: She appears well-developed and well-nourished.  HENT:  Head: Normocephalic and atraumatic.  Eyes: Conjunctivae are normal. Right eye exhibits no discharge. Left eye exhibits no discharge.  Neck: Normal range of motion. Neck supple.  Cardiovascular: Normal rate, regular rhythm and normal heart sounds.   Pulmonary/Chest: Effort normal and breath sounds normal.  Abdominal: Soft. There is no tenderness.  Genitourinary: Uterus is not tender. Cervix exhibits no motion tenderness and no discharge. Right adnexum displays no mass and no tenderness. Left adnexum displays no mass and no tenderness. No tenderness around the vagina. Vaginal discharge (white) found.  Neurological: She is alert.  Skin: Skin is warm and dry.  Psychiatric: She has a normal mood and affect.    ED Course  Procedures (including critical care time) Labs Review Labs Reviewed  WET PREP, GENITAL - Abnormal; Notable for the following:    Clue Cells Wet Prep HPF POC FEW (*)    WBC, Wet  Prep HPF POC RARE (*)    All other components within normal limits  URINALYSIS, ROUTINE W REFLEX MICROSCOPIC - Abnormal; Notable for the following:    APPearance CLOUDY (*)    All other components within normal limits  GC/CHLAMYDIA PROBE AMP  RPR  HIV ANTIBODY (ROUTINE TESTING)  POC URINE PREG, ED    Imaging Review No results found.   EKG Interpretation None      11:50 AM Patient seen and examined. Work-up initiated. Patient agrees to HIV/RPR  testing. Will treat for GC/chlamydia. Will perform pelvic exam.   Vital signs reviewed and are as follows: Filed Vitals:   12/09/13 0945  BP: 130/78  Pulse: 87  Temp: 99.4 F (37.4 C)  Resp: 16   Pelvic exam performed with nurse chaperone by Quentin Cornwall PA-S2 under my supervision.  1:27 PM Patient informed of all results. Given few clue cells, will treat for BV as well.   The patient was urged to return to the Emergency Department immediately with worsening of current symptoms, worsening abdominal pain, persistent vomiting, blood noted in stools, fever, or any other concerns. The patient verbalized understanding.   Pt seen and examined. Will test and treat for STD exposure. Patient counseled on safe sexual practices. Told them that they should not have sexual contact for next 7 days and that they need to inform sexual partners so that they can get tested and treated as well. Urged f/u with [facility].  Patient verbalizes understanding and agrees with plan.      MDM   Final diagnoses:  Vaginal discharge  Bacterial vaginosis   Patient with vaginal discharge, concern for STD. Patient treated with Rocephin and azithromycin. She given treatment for as well given findings on. Otherwise patient appears well, nontoxic does not have appreciable abdominal pain, fever, vomiting, or cervical motion tenderness to suggest PID. She does not have unilateral adnexal tenderness to suggest tubo-ovarian abscess or other systemic symptoms of illness. Do not feel that a CT scan or ultrasound is indicated at this time. Appropriate return instructions discussed with patient. She appears reliable to return or followup as needed.  No dangerous or life-threatening conditions suspected or identified by history, physical exam, and by work-up. No indications for hospitalization identified.      Carlisle Cater, PA-C 12/09/13 1330

## 2013-12-10 LAB — HIV ANTIBODY (ROUTINE TESTING W REFLEX): HIV: NONREACTIVE

## 2013-12-10 NOTE — ED Provider Notes (Signed)
Medical screening examination/treatment/procedure(s) were performed by non-physician practitioner and as supervising physician I was immediately available for consultation/collaboration.   EKG Interpretation None       Kimberly Bennett. Alvino Chapel, MD 12/10/13 1054

## 2013-12-11 LAB — GC/CHLAMYDIA PROBE AMP
CT Probe RNA: NEGATIVE
GC Probe RNA: NEGATIVE

## 2014-03-26 LAB — CYTOLOGY - PAP: Pap: NEGATIVE

## 2014-06-05 ENCOUNTER — Encounter (HOSPITAL_COMMUNITY): Payer: Self-pay | Admitting: *Deleted

## 2014-06-05 ENCOUNTER — Inpatient Hospital Stay (HOSPITAL_COMMUNITY)
Admission: AD | Admit: 2014-06-05 | Discharge: 2014-06-05 | Disposition: A | Discharge status: Home / Self Care | Payer: Medicaid Other | Source: Ambulatory Visit | Attending: Obstetrics & Gynecology | Admitting: Obstetrics & Gynecology

## 2014-06-05 DIAGNOSIS — O23591 Infection of other part of genital tract in pregnancy, first trimester: Secondary | ICD-10-CM | POA: Diagnosis not present

## 2014-06-05 DIAGNOSIS — B373 Candidiasis of vulva and vagina: Secondary | ICD-10-CM | POA: Diagnosis not present

## 2014-06-05 DIAGNOSIS — O210 Mild hyperemesis gravidarum: Secondary | ICD-10-CM | POA: Diagnosis present

## 2014-06-05 DIAGNOSIS — Z3A09 9 weeks gestation of pregnancy: Secondary | ICD-10-CM | POA: Insufficient documentation

## 2014-06-05 DIAGNOSIS — O219 Vomiting of pregnancy, unspecified: Secondary | ICD-10-CM

## 2014-06-05 LAB — URINALYSIS, ROUTINE W REFLEX MICROSCOPIC
BILIRUBIN URINE: NEGATIVE
Glucose, UA: NEGATIVE mg/dL
Ketones, ur: NEGATIVE mg/dL
LEUKOCYTES UA: NEGATIVE
NITRITE: NEGATIVE
Protein, ur: NEGATIVE mg/dL
Specific Gravity, Urine: >1.03 — ABNORMAL HIGH (ref 1.005–1.030)
UROBILINOGEN UA: 0.2 mg/dL (ref 0.0–1.0)
pH: 6 (ref 5.0–8.0)

## 2014-06-05 LAB — POCT PREGNANCY, URINE: Preg Test, Ur: POSITIVE — AB

## 2014-06-05 LAB — WET PREP, GENITAL
Clue Cells Wet Prep HPF POC: NONE SEEN
TRICH WET PREP: NONE SEEN
YEAST WET PREP: NONE SEEN

## 2014-06-05 LAB — URINE MICROSCOPIC-ADD ON

## 2014-06-05 MED ORDER — ONDANSETRON HCL 4 MG/2ML IJ SOLN
4.0000 mg | Freq: Once | INTRAMUSCULAR | Status: AC
  Administered 2014-06-05: 4 mg via INTRAVENOUS
  Filled 2014-06-05: qty 2

## 2014-06-05 MED ORDER — LACTATED RINGERS IV BOLUS (SEPSIS)
1000.0000 mL | Freq: Once | INTRAVENOUS | Status: AC
  Administered 2014-06-05: 1000 mL via INTRAVENOUS

## 2014-06-05 MED ORDER — ONDANSETRON HCL 8 MG PO TABS: 8.0000 mg | ORAL_TABLET | Freq: Three times a day (TID) | ORAL | Status: DC | PRN

## 2014-06-05 MED ORDER — FLUCONAZOLE 150 MG PO TABS: 150.0000 mg | ORAL_TABLET | Freq: Every day | ORAL | Status: DC

## 2014-06-05 NOTE — Discharge Instructions (Signed)
Morning Sickness °Morning sickness is when you feel sick to your stomach (nauseous) during pregnancy. You may feel sick to your stomach and throw up (vomit). You may feel sick in the morning, but you can feel this way any time of day. Some women feel very sick to their stomach and cannot stop throwing up (hyperemesis gravidarum). °HOME CARE °· Only take medicines as told by your doctor. °· Take multivitamins as told by your doctor. Taking multivitamins before getting pregnant can stop or lessen the harshness of morning sickness. °· Eat dry toast or unsalted crackers before getting out of bed. °· Eat 5 to 6 small meals a day. °· Eat dry and bland foods like rice and baked potatoes. °· Do not drink liquids with meals. Drink between meals. °· Do not eat greasy, fatty, or spicy foods. °· Have someone cook for you if the smell of food causes you to feel sick or throw up. °· If you feel sick to your stomach after taking prenatal vitamins, take them at night or with a snack. °· Eat protein when you need a snack (nuts, yogurt, cheese). °· Eat unsweetened gelatins for dessert. °· Wear a bracelet used for sea sickness (acupressure wristband). °· Go to a doctor that puts thin needles into certain body points (acupuncture) to improve how you feel. °· Do not smoke. °· Use a humidifier to keep the air in your house free of odors. °· Get lots of fresh air. °GET HELP IF: °· You need medicine to feel better. °· You feel dizzy or lightheaded. °· You are losing weight. °GET HELP RIGHT AWAY IF:  °· You feel very sick to your stomach and cannot stop throwing up. °· You pass out (faint). °MAKE SURE YOU: °· Understand these instructions. °· Will watch your condition. °· Will get help right away if you are not doing well or get worse. °Document Released: 09/17/2004 Document Revised: 08/15/2013 Document Reviewed: 01/25/2013 °ExitCare® Patient Information ©2015 [employer]. This information is not intended to replace advice given to you by  your health care provider. Make sure you discuss any questions you have with your health care provider. ° °Eating Plan for Hyperemesis Gravidarum °Severe cases of hyperemesis gravidarum can lead to dehydration and malnutrition. The hyperemesis eating plan is one way to lessen the symptoms of nausea and vomiting. It is often used with prescribed medicines to control your symptoms.  °WHAT CAN I DO TO RELIEVE MY SYMPTOMS? °Listen to your body. Everyone is different and has different preferences. Find what works best for you. Some of the following things may help: °· Eat and drink slowly. °· Eat 5-6 small meals daily instead of 3 large meals.   °· Eat crackers before you get out of bed in the morning.   °· Starchy foods are usually well tolerated (such as cereal, toast, bread, potatoes, pasta, rice, and pretzels).   °· Ginger may help with nausea. Add ¼ tsp ground ginger to hot tea or choose ginger tea.   °· Try drinking 100% fruit juice or an electrolyte drink. °· Continue to take your prenatal vitamins as directed by your health care provider. If you are having trouble taking your prenatal vitamins, talk with your health care provider about different options. °· Include at least 1 serving of protein with your meals and snacks (such as meats or poultry, beans, nuts, eggs, or yogurt). Try eating a protein-rich snack before bed (such as cheese and crackers or a half turkey or peanut butter sandwich). °WHAT THINGS SHOULD I   AVOID TO REDUCE MY SYMPTOMS? °The following things may help reduce your symptoms: °· Avoid foods with strong smells. Try eating meals in well-ventilated areas that are free of odors. °· Avoid drinking water or other beverages with meals. Try not to drink anything less than 30 minutes before and after meals. °· Avoid drinking more than 1 cup of fluid at a time. °· Avoid fried or high-fat foods, such as butter and cream sauces. °· Avoid spicy foods. °· Avoid skipping meals the best you can. Nausea can be  more intense on an empty stomach. If you cannot tolerate food at that time, do not force it. Try sucking on ice chips or other frozen items and make up the calories later. °· Avoid lying down within 2 hours after eating. °Document Released: 06/07/2007 Document Revised: 08/15/2013 Document Reviewed: 06/14/2013 °ExitCare® Patient Information ©2015 [employer]. This information is not intended to replace advice given to you by your health care provider. Make sure you discuss any questions you have with your health care provider. ° °

## 2014-06-05 NOTE — MAU Provider Note (Signed)
History     CSN: [id number]  Arrival date and time: 06/05/14 1884   First Provider Initiated Contact with Patient 06/05/14 [phone]      Chief Complaint  Patient presents with  . Possible Pregnancy  . Emesis   HPI Ms. Kimberly Bennett is a 33 y.o. [id number] at [redacted]w[redacted]d who presents to MAU today with complaint of N/V since last night. She states that she is unable to keep anything down. She also complains of mild mid abdominal pain. She denies lower abdominal pain. She also endorses loose stool this morning and possible melena. She denies vaginal bleeding, but states she "might had a yeast infection." she denies fever or sick contact. She has an appointment to terminate the pregnancy on Saturday.   OB History   Grav Para Term Preterm Abortions TAB SAB Ect Mult Living   6 4 3 1 1  0 1 0 0 4      Past Medical History  Diagnosis Date  . Urinary tract infection   . Chlamydia   . Trichomonas   . Abnormal Pap smear of cervix     colposcopy 2012, Jan  . Pregnancy induced hypertension     Past Surgical History  Procedure Laterality Date  . Colposcopy    . No past surgeries    . Dilation and curettage of uterus      History reviewed. No pertinent family history.  History  Substance Use Topics  . Smoking status: Never Smoker   . Smokeless tobacco: Never Used  . Alcohol Use: No    Allergies:  Allergies  Allergen Reactions  . Penicillins Hives and Itching  . Latex Rash    No prescriptions prior to admission    Review of Systems  Constitutional: Negative for fever and malaise/fatigue.  Gastrointestinal: Positive for nausea, vomiting and diarrhea. Negative for abdominal pain.  Genitourinary: Negative for dysuria, urgency and frequency.       Neg - vaginal bleeding + vaginal discharge   Physical Exam   Blood pressure 126/80, pulse 85, temperature 98.9 F (37.2 C), temperature source Oral, resp. rate 16, height 5\' 6"  (1.676 m), weight 147 lb (66.679 kg), last menstrual  period 04/02/2014, SpO2 100.00%, currently breastfeeding.  Physical Exam  Constitutional: She is oriented to person, place, and time. She appears well-developed and well-nourished. No distress.  HENT:  Head: Normocephalic.  Cardiovascular: Normal rate.   Respiratory: Effort normal.  GI: Soft. She exhibits no distension and no mass. There is no tenderness. There is no rebound and no guarding.  Genitourinary: Uterus is not enlarged and not tender. Cervix exhibits no motion tenderness, no discharge and no friability. Right adnexum displays no mass and no tenderness. Left adnexum displays no mass and no tenderness. No bleeding around the vagina. Vaginal discharge (moderate amount of thick, clumpy discharge noted) found.  Neurological: She is alert and oriented to person, place, and time.  Skin: Skin is warm and dry. No erythema.  Psychiatric: She has a normal mood and affect.   Results for orders placed during the hospital encounter of 06/05/14 (from the past 24 hour(s))  URINALYSIS, ROUTINE W REFLEX MICROSCOPIC     Status: Abnormal   Collection Time    06/05/14  8:05 AM      Result Value Ref Range   Color, Urine YELLOW  YELLOW   APPearance CLEAR  CLEAR   Specific Gravity, Urine >1.030 (*) 1.005 - 1.030   pH 6.0  5.0 - 8.0   Glucose, UA NEGATIVE  NEGATIVE mg/dL   Hgb urine dipstick SMALL (*) NEGATIVE   Bilirubin Urine NEGATIVE  NEGATIVE   Ketones, ur NEGATIVE  NEGATIVE mg/dL   Protein, ur NEGATIVE  NEGATIVE mg/dL   Urobilinogen, UA 0.2  0.0 - 1.0 mg/dL   Nitrite NEGATIVE  NEGATIVE   Leukocytes, UA NEGATIVE  NEGATIVE  URINE MICROSCOPIC-ADD ON     Status: None   Collection Time    06/05/14  8:05 AM      Result Value Ref Range   Squamous Epithelial / LPF RARE  RARE   WBC, UA 0-2  <3 WBC/hpf   Bacteria, UA RARE  RARE  POCT PREGNANCY, URINE     Status: Abnormal   Collection Time    06/05/14  8:26 AM      Result Value Ref Range   Preg Test, Ur POSITIVE (*) NEGATIVE  WET PREP, GENITAL      Status: Abnormal   Collection Time    06/05/14  9:46 AM      Result Value Ref Range   Yeast Wet Prep HPF POC NONE SEEN  NONE SEEN   Trich, Wet Prep NONE SEEN  NONE SEEN   Clue Cells Wet Prep HPF POC NONE SEEN  NONE SEEN   WBC, Wet Prep HPF POC FEW (*) NONE SEEN    MAU Course  Procedures None  MDM +UPT UA today shows mild dehydration IV LR bolus with 4 mg Zofran given Discussed use of Zofran in first trimester, patient agrees since she plans to terminate the pregnancy and is driving so cannot have Phenergan.   Assessment and Plan  A: Nausea and vomiting in pregnancy prior to [redacted] weeks gestation Yeast vulvovaginitis, clinical  P: Discharge home Rx for Zofran and Diflucan given to patient Pregnancy confirmation letter given to patient Patient advised to follow-up as scheduled for termination on Saturday if desired Patient may return to MAU as needed or if her condition were to change or worsen   Luvenia Redden, PA-C  06/05/2014, 11:05 AM

## 2014-06-05 NOTE — MAU Note (Signed)
Patient states that after the August period she took BCP starting on 8-16 for only 4 days, then started bleeding again.

## 2014-06-05 NOTE — MAU Note (Signed)
Patient states she has had a positive home pregnancy test. States she has had nonstop vomiting since last night. Denies pain, bleeding or discharge.

## 2014-06-25 ENCOUNTER — Encounter (HOSPITAL_COMMUNITY): Payer: Self-pay | Admitting: *Deleted

## 2014-07-23 DIAGNOSIS — Z3202 Encounter for pregnancy test, result negative: Secondary | ICD-10-CM | POA: Diagnosis not present

## 2014-07-23 DIAGNOSIS — Z87440 Personal history of urinary (tract) infections: Secondary | ICD-10-CM | POA: Insufficient documentation

## 2014-07-23 DIAGNOSIS — Z79899 Other long term (current) drug therapy: Secondary | ICD-10-CM | POA: Diagnosis not present

## 2014-07-23 DIAGNOSIS — Z880 Allergy status to penicillin: Secondary | ICD-10-CM | POA: Insufficient documentation

## 2014-07-23 DIAGNOSIS — Z8619 Personal history of other infectious and parasitic diseases: Secondary | ICD-10-CM | POA: Diagnosis not present

## 2014-07-23 DIAGNOSIS — N939 Abnormal uterine and vaginal bleeding, unspecified: Secondary | ICD-10-CM | POA: Diagnosis present

## 2014-07-23 DIAGNOSIS — Z793 Long term (current) use of hormonal contraceptives: Secondary | ICD-10-CM | POA: Diagnosis not present

## 2014-07-23 DIAGNOSIS — N72 Inflammatory disease of cervix uteri: Secondary | ICD-10-CM | POA: Diagnosis not present

## 2014-07-23 DIAGNOSIS — Z91040 Latex allergy status: Secondary | ICD-10-CM | POA: Insufficient documentation

## 2014-07-24 ENCOUNTER — Encounter (HOSPITAL_COMMUNITY): Payer: Self-pay | Admitting: Emergency Medicine

## 2014-07-24 ENCOUNTER — Emergency Department (HOSPITAL_COMMUNITY)
Admission: EM | Admit: 2014-07-24 | Discharge: 2014-07-24 | Disposition: A | Discharge status: Home / Self Care | Payer: Medicaid Other | Attending: Emergency Medicine | Admitting: Emergency Medicine

## 2014-07-24 DIAGNOSIS — N72 Inflammatory disease of cervix uteri: Secondary | ICD-10-CM

## 2014-07-24 LAB — COMPREHENSIVE METABOLIC PANEL
ALK PHOS: 43 U/L (ref 39–117)
ALT: 11 U/L (ref 0–35)
AST: 16 U/L (ref 0–37)
Albumin: 3.8 g/dL (ref 3.5–5.2)
Anion gap: 16 — ABNORMAL HIGH (ref 5–15)
BUN: 8 mg/dL (ref 6–23)
CHLORIDE: 99 meq/L (ref 96–112)
CO2: 20 meq/L (ref 19–32)
Calcium: 9.3 mg/dL (ref 8.4–10.5)
Creatinine, Ser: 0.59 mg/dL (ref 0.50–1.10)
Glucose, Bld: 131 mg/dL — ABNORMAL HIGH (ref 70–99)
Potassium: 3.5 mEq/L — ABNORMAL LOW (ref 3.7–5.3)
SODIUM: 135 meq/L — AB (ref 137–147)
Total Bilirubin: 0.4 mg/dL (ref 0.3–1.2)
Total Protein: 8 g/dL (ref 6.0–8.3)

## 2014-07-24 LAB — WET PREP, GENITAL
Trich, Wet Prep: NONE SEEN
Yeast Wet Prep HPF POC: NONE SEEN

## 2014-07-24 LAB — URINALYSIS, ROUTINE W REFLEX MICROSCOPIC
BILIRUBIN URINE: NEGATIVE
GLUCOSE, UA: NEGATIVE mg/dL
KETONES UR: 15 mg/dL — AB
LEUKOCYTES UA: NEGATIVE
Nitrite: NEGATIVE
PH: 5.5 (ref 5.0–8.0)
Protein, ur: NEGATIVE mg/dL
Specific Gravity, Urine: 1.03 (ref 1.005–1.030)
Urobilinogen, UA: 1 mg/dL (ref 0.0–1.0)

## 2014-07-24 LAB — CBC WITH DIFFERENTIAL/PLATELET
Basophils Absolute: 0 10*3/uL (ref 0.0–0.1)
Basophils Relative: 0 % (ref 0–1)
Eosinophils Absolute: 0.2 10*3/uL (ref 0.0–0.7)
Eosinophils Relative: 2 % (ref 0–5)
HCT: 37.5 % (ref 36.0–46.0)
HEMOGLOBIN: 12.3 g/dL (ref 12.0–15.0)
LYMPHS ABS: 1.5 10*3/uL (ref 0.7–4.0)
Lymphocytes Relative: 18 % (ref 12–46)
MCH: 25.3 pg — ABNORMAL LOW (ref 26.0–34.0)
MCHC: 32.8 g/dL (ref 30.0–36.0)
MCV: 77 fL — AB (ref 78.0–100.0)
MONOS PCT: 8 % (ref 3–12)
Monocytes Absolute: 0.7 10*3/uL (ref 0.1–1.0)
NEUTROS PCT: 72 % (ref 43–77)
NRBC: 0 /100{WBCs}
Neutro Abs: 6.2 10*3/uL (ref 1.7–7.7)
PLATELETS: 322 10*3/uL (ref 150–400)
RBC: 4.87 MIL/uL (ref 3.87–5.11)
RDW: 13.7 % (ref 11.5–15.5)
WBC: 8.6 10*3/uL (ref 4.0–10.5)

## 2014-07-24 LAB — URINE MICROSCOPIC-ADD ON

## 2014-07-24 LAB — PREGNANCY, URINE: Preg Test, Ur: NEGATIVE

## 2014-07-24 MED ORDER — DOXYCYCLINE HYCLATE 100 MG PO CAPS: 100.0000 mg | ORAL_CAPSULE | Freq: Two times a day (BID) | ORAL | Status: DC

## 2014-07-24 MED ORDER — CEFTRIAXONE SODIUM 250 MG IJ SOLR
250.0000 mg | Freq: Once | INTRAMUSCULAR | Status: AC
  Administered 2014-07-24: 250 mg via INTRAMUSCULAR
  Filled 2014-07-24: qty 250

## 2014-07-24 MED ORDER — LIDOCAINE HCL (PF) 1 % IJ SOLN
INTRAMUSCULAR | Status: AC
  Administered 2014-07-24: 0.9 mL
  Filled 2014-07-24: qty 5

## 2014-07-24 NOTE — ED Provider Notes (Signed)
CSN: [id number]     Arrival date & time 07/23/14  2350 History  This chart was scribed for Maida Sale, MD by Hilda Lias, ED Scribe. This patient was seen in room D32C/D32C and the patient's care was started at 1:49 AM.    Chief Complaint  Patient presents with  . Vaginal Bleeding    The history is provided by the patient. No language interpreter was used.     HPI Comments: Kimberly Bennett is a 33 y.o. female who presents to the Emergency Department complaining of vaginal discharge with associated bleeding that has been present for the past two days. She denies any pelvic pain. She's had no urinary symptoms including dysuria, hematuria, frequency or urgency. She denies abdominal pain, nausea, vomiting or diarrhea. She states she is sexually active but uses condoms.     Past Medical History  Diagnosis Date  . Urinary tract infection   . Chlamydia   . Trichomonas   . Abnormal Pap smear of cervix     colposcopy 2012, Jan  . Pregnancy induced hypertension    Past Surgical History  Procedure Laterality Date  . Colposcopy    . No past surgeries    . Dilation and curettage of uterus     No family history on file. History  Substance Use Topics  . Smoking status: Never Smoker   . Smokeless tobacco: Never Used  . Alcohol Use: No   OB History    Gravida Para Term Preterm AB TAB SAB Ectopic Multiple Living   6 4 3 1 1  0 1 0 0 4     Review of Systems  Constitutional: Negative for fever and chills.  Respiratory: Negative for shortness of breath.   Cardiovascular: Negative for chest pain.  Gastrointestinal: Negative for nausea, vomiting, abdominal pain, diarrhea and constipation.  Genitourinary: Positive for vaginal bleeding and vaginal discharge. Negative for dysuria, frequency, hematuria, flank pain, vaginal pain and pelvic pain.  Musculoskeletal: Negative for myalgias, back pain, neck pain and neck stiffness.  Skin: Negative for rash and wound.  Neurological:  Negative for dizziness, weakness, light-headedness, numbness and headaches.  All other systems reviewed and are negative.     Allergies  Penicillins and Latex  Home Medications   Prior to Admission medications   Medication Sig Start Date End Date Taking? Authorizing Provider  ondansetron (ZOFRAN) 8 MG tablet Take 1 tablet (8 mg total) by mouth every 8 (eight) hours as needed for nausea or vomiting. 06/05/14  Yes Luvenia Redden, PA-C  PRESCRIPTION MEDICATION Take 1 tablet by mouth daily. Birth control pill   Yes Historical Provider, MD  doxycycline (VIBRAMYCIN) 100 MG capsule Take 1 capsule (100 mg total) by mouth 2 (two) times daily. One po bid x 7 days 07/24/14   Julianne Rice, MD  fluconazole (DIFLUCAN) 150 MG tablet Take 1 tablet (150 mg total) by mouth daily. Patient not taking: Reported on 07/24/2014 06/05/14   Luvenia Redden, PA-C   BP 146/86 mmHg  Pulse 106  Temp(Src) 98.2 F (36.8 C) (Oral)  Resp 16  Ht 5\' 4"  (1.626 m)  Wt 146 lb (66.225 kg)  BMI 25.05 kg/m2  SpO2 100%  LMP 04/02/2014  Breastfeeding? Unknown Physical Exam  Constitutional: She is oriented to person, place, and time. She appears well-developed and well-nourished. No distress.  HENT:  Head: Normocephalic and atraumatic.  Mouth/Throat: Oropharynx is clear and moist.  Eyes: EOM are normal. Pupils are equal, round, and reactive to light.  Neck: Normal  range of motion. Neck supple.  Cardiovascular: Normal rate and regular rhythm.   Pulmonary/Chest: Effort normal and breath sounds normal. No respiratory distress. She has no wheezes. She has no rales.  Abdominal: Soft. Bowel sounds are normal. She exhibits no distension and no mass. There is no tenderness. There is no rebound and no guarding.  Genitourinary: Vaginal discharge found.  Thick purulent discharge coming from the cervical os. There is no cervical motion tenderness. No adnexal or fundal tenderness.  Musculoskeletal: Normal range of motion. She  exhibits no edema or tenderness.  Neurological: She is alert and oriented to person, place, and time.  Skin: Skin is warm and dry. No rash noted. No erythema.  Psychiatric: She has a normal mood and affect. Her behavior is normal.  Nursing note and vitals reviewed.   ED Course  Procedures (including critical care time)  DIAGNOSTIC STUDIES: Oxygen Saturation is 98% on RA, normal by my interpretation.    COORDINATION OF CARE: 1:51 AM Discussed treatment plan with pt at bedside and pt agreed to plan.   Labs Review Labs Reviewed  WET PREP, GENITAL - Abnormal; Notable for the following:    Clue Cells Wet Prep HPF POC FEW (*)    WBC, Wet Prep HPF POC TOO NUMEROUS TO COUNT (*)    All other components within normal limits  CBC WITH DIFFERENTIAL - Abnormal; Notable for the following:    MCV 77.0 (*)    MCH 25.3 (*)    All other components within normal limits  COMPREHENSIVE METABOLIC PANEL - Abnormal; Notable for the following:    Sodium 135 (*)    Potassium 3.5 (*)    Glucose, Bld 131 (*)    Anion gap 16 (*)    All other components within normal limits  URINALYSIS, ROUTINE W REFLEX MICROSCOPIC - Abnormal; Notable for the following:    Hgb urine dipstick MODERATE (*)    Ketones, ur 15 (*)    All other components within normal limits  URINE MICROSCOPIC-ADD ON - Abnormal; Notable for the following:    Squamous Epithelial / LPF FEW (*)    All other components within normal limits  GC/CHLAMYDIA PROBE AMP  PREGNANCY, URINE    Imaging Review No results found.   EKG Interpretation None      MDM   Final diagnoses:  Cervicitis    I personally performed the services described in this documentation, which was scribed in my presence. The recorded information has been reviewed and is accurate.   IM Rocephin given in the emergency department without adverse reaction. We'll discharge home with prescription for 7 days of doxycycline. Patient been advised to follow-up with OB/GYN.  Return precautions given.  Julianne Rice, MD 07/24/14 [phone]

## 2014-07-24 NOTE — ED Notes (Signed)
Pt c/o brown colored discharge x several days. Reports starting birth control pills in October, has been skipping the placebo pills, and continued taking birth control pills in order to prevent periods. Discharge is brown in colored, denies odor, denies pain

## 2014-07-24 NOTE — ED Notes (Signed)
Patient alert and oriented at discharge.  Patient ambulatory to the waiting room with this RN with steady ambulation.

## 2014-07-24 NOTE — ED Notes (Signed)
Pt. reports vaginal bleeding/ discharge for 4 days , denies injury / no abdominal pain , no fever or chills.

## 2014-07-24 NOTE — Discharge Instructions (Signed)
Cervicitis °Cervicitis is a soreness and swelling (inflammation) of the cervix. Your cervix is located at the bottom of your uterus. It opens up to the vagina. °CAUSES  °· Sexually transmitted infections (STIs).   °· Allergic reaction.   °· Medicines or birth control devices that are put in the vagina.   °· Injury to the cervix.   °· Bacterial infections.   °RISK FACTORS °You are at greater risk if you: °· Have unprotected sexual intercourse. °· Have sexual intercourse with many partners. °· Began sexual intercourse at an early age. °· Have a history of STIs. °SYMPTOMS  °There may be no symptoms. If symptoms occur, they may include:  °· Gray, white, yellow, or bad-smelling vaginal discharge.   °· Pain or itching of the area outside the vagina.   °· Painful sexual intercourse.   °· Lower abdominal or lower back pain, especially during intercourse.   °· Frequent urination.   °· Abnormal vaginal bleeding between periods, after sexual intercourse, or after menopause.   °· Pressure or a heavy feeling in the pelvis.   °DIAGNOSIS  °Diagnosis is made after a pelvic exam. Other tests may include:  °· Examination of any discharge under a microscope (wet prep).   °· A Pap test.   °TREATMENT  °Treatment will depend on the cause of cervicitis. If it is caused by an STI, both you and your partner will need to be treated. Antibiotic medicines will be given.  °HOME CARE INSTRUCTIONS  °· Do not have sexual intercourse until your health care provider says it is okay.   °· Do not have sexual intercourse until your partner has been treated, if your cervicitis is caused by an STI.   °· Take your antibiotics as directed. Finish them even if you start to feel better.   °SEEK MEDICAL CARE IF: °· Your symptoms come back.   °· You have a fever.   °MAKE SURE YOU:  °· Understand these instructions. °· Will watch your condition. °· Will get help right away if you are not doing well or get worse. °Document Released: 08/10/2005 Document Revised:  08/15/2013 Document Reviewed: 02/01/2013 °Exit[employer]® Patient Information ©2015 [employer]. This information is not intended to replace advice given to you by your health care provider. Make sure you discuss any questions you have with your health care provider. ° °

## 2014-07-24 NOTE — ED Notes (Signed)
Dr.Yelverton aware pt is ready for discharge

## 2014-07-25 LAB — GC/CHLAMYDIA PROBE AMP
CT PROBE, AMP APTIMA: NEGATIVE
GC PROBE AMP APTIMA: NEGATIVE

## 2014-07-26 ENCOUNTER — Telehealth (HOSPITAL_BASED_OUTPATIENT_CLINIC_OR_DEPARTMENT_OTHER): Payer: Self-pay | Admitting: Emergency Medicine

## 2014-09-30 ENCOUNTER — Inpatient Hospital Stay (HOSPITAL_COMMUNITY): Payer: Medicaid Other

## 2014-09-30 ENCOUNTER — Inpatient Hospital Stay (HOSPITAL_COMMUNITY)
Admission: AD | Admit: 2014-09-30 | Discharge: 2014-09-30 | Disposition: A | Discharge status: Home / Self Care | Payer: Medicaid Other | Source: Ambulatory Visit | Attending: Family Medicine | Admitting: Family Medicine

## 2014-09-30 ENCOUNTER — Encounter (HOSPITAL_COMMUNITY): Payer: Self-pay | Admitting: *Deleted

## 2014-09-30 DIAGNOSIS — N939 Abnormal uterine and vaginal bleeding, unspecified: Secondary | ICD-10-CM | POA: Insufficient documentation

## 2014-09-30 LAB — URINALYSIS, ROUTINE W REFLEX MICROSCOPIC
Bilirubin Urine: NEGATIVE
Glucose, UA: NEGATIVE mg/dL
KETONES UR: 15 mg/dL — AB
Leukocytes, UA: NEGATIVE
Nitrite: NEGATIVE
Protein, ur: NEGATIVE mg/dL
Specific Gravity, Urine: 1.01 (ref 1.005–1.030)
UROBILINOGEN UA: 1 mg/dL (ref 0.0–1.0)
pH: 6 (ref 5.0–8.0)

## 2014-09-30 LAB — CBC
HCT: 37.7 % (ref 36.0–46.0)
Hemoglobin: 12.4 g/dL (ref 12.0–15.0)
MCH: 26 pg (ref 26.0–34.0)
MCHC: 32.9 g/dL (ref 30.0–36.0)
MCV: 79 fL (ref 78.0–100.0)
Platelets: 328 10*3/uL (ref 150–400)
RBC: 4.77 MIL/uL (ref 3.87–5.11)
RDW: 14.5 % (ref 11.5–15.5)
WBC: 7.2 10*3/uL (ref 4.0–10.5)

## 2014-09-30 LAB — URINE MICROSCOPIC-ADD ON

## 2014-09-30 LAB — HCG, QUANTITATIVE, PREGNANCY: hCG, Beta Chain, Quant, S: <1 m[IU]/mL (ref <5)

## 2014-09-30 LAB — POCT PREGNANCY, URINE: PREG TEST UR: NEGATIVE

## 2014-09-30 MED ORDER — MEDROXYPROGESTERONE ACETATE 10 MG PO TABS: 10.0000 mg | ORAL_TABLET | Freq: Every day | ORAL | Status: DC

## 2014-09-30 NOTE — MAU Note (Signed)
Patient presents with complaint of vaginal bleeding X 2 months.

## 2014-09-30 NOTE — MAU Provider Note (Signed)
History     CSN: [id number]  Arrival date and time: 09/30/14 1722   First Provider Initiated Contact with Patient 09/30/14 1800      Chief Complaint  Patient presents with  . Vaginal Bleeding   HPI Kimberly Bennett 34 y.o.  Comes to MAU with extended vaginal bleeding since mid November.  Has had almost daily bleeding - sometimes light and sometimes heavy.  Had TAB in October and then started birth control pills in mid Nov.  Started bleeding while she was on the pills for 2 weeks.  Stopped the pills.  Brevicon [facility].  She went back and then took Micronor but she was still bleeding.  Then stopped all pills but has continued to have vaginal bleeding.  Wants to know what is wrong and why she is still bleeding.  Thinks her body needs to be "reset" but is very leary of taking hormones.  She was seen at the ER on 07-24-14 and had an STD screen.  She was treated with Rocephin and doxycycline but has continued to bleed.  Her STD screen was negative.  She has not had any intercourse since that exam.  Unable to void upon arrival to MAU.  Awaiting urine collection.  OB History    Gravida Para Term Preterm AB TAB SAB Ectopic Multiple Living   6 4 3 1 2 1 1  0 0 4      Past Medical History  Diagnosis Date  . Urinary tract infection   . Chlamydia   . Trichomonas   . Abnormal Pap smear of cervix     colposcopy 2012, Jan  . Pregnancy induced hypertension     Past Surgical History  Procedure Laterality Date  . Colposcopy    . Dilation and curettage of uterus      History reviewed. No pertinent family history.  History  Substance Use Topics  . Smoking status: Never Smoker   . Smokeless tobacco: Never Used  . Alcohol Use: No    Allergies:  Allergies  Allergen Reactions  . Penicillins Hives and Itching  . Latex Rash    Prescriptions prior to admission  Medication Sig Dispense Refill Last Dose  . doxycycline (VIBRAMYCIN) 100 MG capsule Take 1 capsule  (100 mg total) by mouth 2 (two) times daily. One po bid x 7 days (Patient not taking: Reported on 09/30/2014) 14 capsule 0   . fluconazole (DIFLUCAN) 150 MG tablet Take 1 tablet (150 mg total) by mouth daily. (Patient not taking: Reported on 07/24/2014) 1 tablet 1   . ondansetron (ZOFRAN) 8 MG tablet Take 1 tablet (8 mg total) by mouth every 8 (eight) hours as needed for nausea or vomiting. (Patient not taking: Reported on 09/30/2014) 20 tablet 0 couple months    Review of Systems  Constitutional: Negative for fever.  Gastrointestinal: Negative for nausea, vomiting, abdominal pain, diarrhea and constipation.  Genitourinary:       No vaginal discharge. Vaginal bleeding. No dysuria.     Physical Exam   Blood pressure 138/87, pulse 87, temperature 98.2 F (36.8 C), temperature source Oral, resp. rate 16, height 5' 5.5" (1.664 m), weight 141 lb 8 oz (64.184 kg), last menstrual period 07/06/2014, unknown if currently breastfeeding.  Physical Exam  Nursing note and vitals reviewed. Constitutional: She is oriented to person, place, and time. She appears well-developed and well-nourished.  HENT:  Head: Normocephalic.  Eyes: EOM are normal.  Neck: Neck supple.  Musculoskeletal: Normal range of  motion.  Neurological: She is alert and oriented to person, place, and time.  Skin: Skin is warm and dry.  Psychiatric: She has a normal mood and affect.    MAU Course  Procedures  MDM Care assumed by K. Jaclyn Prime, PA at [phone].  Care assumed by [name], CNM at 8:15 PM. Patient in ultrasound.  Results for orders placed or performed during the hospital encounter of 09/30/14 (from the past 24 hour(s))  Urinalysis, Routine w reflex microscopic     Status: Abnormal   Collection Time: 09/30/14  6:20 PM  Result Value Ref Range   Color, Urine YELLOW YELLOW   APPearance CLEAR CLEAR   Specific Gravity, Urine 1.010 1.005 - 1.030   pH 6.0 5.0 - 8.0   Glucose, UA NEGATIVE NEGATIVE mg/dL   Hgb  urine dipstick LARGE (A) NEGATIVE   Bilirubin Urine NEGATIVE NEGATIVE   Ketones, ur 15 (A) NEGATIVE mg/dL   Protein, ur NEGATIVE NEGATIVE mg/dL   Urobilinogen, UA 1.0 0.0 - 1.0 mg/dL   Nitrite NEGATIVE NEGATIVE   Leukocytes, UA NEGATIVE NEGATIVE  Urine microscopic-add on     Status: Abnormal   Collection Time: 09/30/14  6:20 PM  Result Value Ref Range   Squamous Epithelial / LPF FEW (A) RARE   WBC, UA 0-2 <3 WBC/hpf   RBC / HPF 11-20 <3 RBC/hpf  Pregnancy, urine POC     Status: None   Collection Time: 09/30/14  6:26 PM  Result Value Ref Range   Preg Test, Ur NEGATIVE NEGATIVE  CBC     Status: None   Collection Time: 09/30/14  6:43 PM  Result Value Ref Range   WBC 7.2 4.0 - 10.5 K/uL   RBC 4.77 3.87 - 5.11 MIL/uL   Hemoglobin 12.4 12.0 - 15.0 g/dL   HCT 37.7 36.0 - 46.0 %   MCV 79.0 78.0 - 100.0 fL   MCH 26.0 26.0 - 34.0 pg   MCHC 32.9 30.0 - 36.0 g/dL   RDW 14.5 11.5 - 15.5 %   Platelets 328 150 - 400 K/uL  hCG, quantitative, pregnancy     Status: None   Collection Time: 09/30/14  6:43 PM  Result Value Ref Range   hCG, Beta Chain, Quant, S <1 <5 mIU/mL    US Transvaginal Non-ob  09/30/2014   CLINICAL DATA:  Vaginal bleeding since 07/07/2015  EXAM: TRANSABDOMINAL AND TRANSVAGINAL ULTRASOUND OF PELVIS  TECHNIQUE: Both transabdominal and transvaginal ultrasound examinations of the pelvis were performed. Transabdominal technique was performed for global imaging of the pelvis including uterus, ovaries, adnexal regions, and pelvic cul-de-sac. It was necessary to proceed with endovaginal exam following the transabdominal exam to visualize the endometrium and ovaries.  COMPARISON:  08/19/2011 Ob ultrasound  FINDINGS: Uterus  Measurements: 8.4 x 5.4 x 5.5 cm. Retroverted, retroflexed. Inhomogeneous myometrium without focal abnormality. No fibroids or other mass visualized.  Endometrium  Thickness: 2 mm, uniformly echogenic without focal abnormality. No focal abnormality visualized.  Right  ovary  Measurements: 2.4 x 2.4 x 1.2 cm. Normal appearance/no adnexal mass.  Left ovary  Measurements: 2.4 x 1.6 x 1.4 cm. Normal appearance/no adnexal mass.  Other findings  No free fluid.  IMPRESSION: Mildly inhomogeneous myometrium without focal abnormality. This could be a normal variant, although adenomyosis may have a similar appearance and could be confirmed that outpatient nonemergent pelvic MRI.   Electronically Signed   By: Conchita Paris M.D.   On: 09/30/2014 20:48   US Pelvis Complete  09/30/2014  CLINICAL DATA:  Vaginal bleeding since 07/07/2015  EXAM: TRANSABDOMINAL AND TRANSVAGINAL ULTRASOUND OF PELVIS  TECHNIQUE: Both transabdominal and transvaginal ultrasound examinations of the pelvis were performed. Transabdominal technique was performed for global imaging of the pelvis including uterus, ovaries, adnexal regions, and pelvic cul-de-sac. It was necessary to proceed with endovaginal exam following the transabdominal exam to visualize the endometrium and ovaries.  COMPARISON:  08/19/2011 Ob ultrasound  FINDINGS: Uterus  Measurements: 8.4 x 5.4 x 5.5 cm. Retroverted, retroflexed. Inhomogeneous myometrium without focal abnormality. No fibroids or other mass visualized.  Endometrium  Thickness: 2 mm, uniformly echogenic without focal abnormality. No focal abnormality visualized.  Right ovary  Measurements: 2.4 x 2.4 x 1.2 cm. Normal appearance/no adnexal mass.  Left ovary  Measurements: 2.4 x 1.6 x 1.4 cm. Normal appearance/no adnexal mass.  Other findings  No free fluid.  IMPRESSION: Mildly inhomogeneous myometrium without focal abnormality. This could be a normal variant, although adenomyosis may have a similar appearance and could be confirmed that outpatient nonemergent pelvic MRI.   Electronically Signed   By: Conchita Paris M.D.   On: 09/30/2014 20:48     Assessment and Plan   1. Abnormal vaginal bleeding    Discharge home in stable condition. Bleeding precautions. Use condoms if you  have sex before birth control has been re-established. List contraceptive choices and adenomyosis information sheet given. Follow-up Information    Follow up with [facility].   Specialty:  Obstetrics and Gynecology   Why:  Will call you to schedule an appointment in approximately 4 weeks   Contact information:   [place] [phone]      Follow up with [facility].   Why:  As needed in emergencies   Contact information:   [address] [id number] [place] [phone]        Medication List    STOP taking these medications        doxycycline 100 MG capsule  Commonly known as:  VIBRAMYCIN     fluconazole 150 MG tablet  Commonly known as:  DIFLUCAN     ondansetron 8 MG tablet  Commonly known as:  ZOFRAN      TAKE these medications        medroxyPROGESTERone 10 MG tablet  Commonly known as:  PROVERA  Take 1 tablet (10 mg total) by mouth daily.       BURLESON,TERRI 09/30/2014, 6:17 PM   [name], CNM 09/30/2014 10:13 PM

## 2014-09-30 NOTE — Discharge Instructions (Signed)
Ultrasound shows possible adenomyosis. This may be contributing to your abnormal bleeding. It is not dangerous and does not require any other treatment besides getting you're bleeding under control.  Abnormal Uterine Bleeding Abnormal uterine bleeding can affect women at various stages in life, including teenagers, women in their reproductive years, pregnant women, and women who have reached menopause. Several kinds of uterine bleeding are considered abnormal, including:  Bleeding or spotting between periods.   Bleeding after sexual intercourse.   Bleeding that is heavier or more than normal.   Periods that last longer than usual.  Bleeding after menopause.  Many cases of abnormal uterine bleeding are minor and simple to treat, while others are more serious. Any type of abnormal bleeding should be evaluated by your health care provider. Treatment will depend on the cause of the bleeding. HOME CARE INSTRUCTIONS Monitor your condition for any changes. The following actions may help to alleviate any discomfort you are experiencing:  Avoid the use of tampons and douches as directed by your health care provider.  Change your pads frequently. You should get regular pelvic exams and Pap tests. Keep all follow-up appointments for diagnostic tests as directed by your health care provider.  SEEK MEDICAL CARE IF:   Your bleeding lasts more than 1 week.   You feel dizzy at times.  SEEK IMMEDIATE MEDICAL CARE IF:   You pass out.   You are changing pads every 15 to 30 minutes.   You have abdominal pain.  You have a fever.   You become sweaty or weak.   You are passing large blood clots from the vagina.   You start to feel nauseous and vomit. MAKE SURE YOU:   Understand these instructions.  Will watch your condition.  Will get help right away if you are not doing well or get worse. Document Released: 08/10/2005 Document Revised: 08/15/2013 Document Reviewed:  03/09/2013 [facility] Patient Information 2015 [place], [place]. This information is not intended to replace advice given to you by your health care provider. Make sure you discuss any questions you have with your health care provider.  Contraception Choices Contraception (birth control) is the use of any methods or devices to prevent pregnancy. Below are some methods to help avoid pregnancy. HORMONAL METHODS   Contraceptive implant. This is a thin, plastic tube containing progesterone hormone. It does not contain estrogen hormone. Your health care provider inserts the tube in the inner part of the upper arm. The tube can remain in place for up to 3 years. After 3 years, the implant must be removed. The implant prevents the ovaries from releasing an egg (ovulation), thickens the cervical mucus to prevent sperm from entering the uterus, and thins the lining of the inside of the uterus.  Progesterone-only injections. These injections are given every 3 months by your health care provider to prevent pregnancy. This synthetic progesterone hormone stops the ovaries from releasing eggs. It also thickens cervical mucus and changes the uterine lining. This makes it harder for sperm to survive in the uterus.  Birth control pills. These pills contain estrogen and progesterone hormone. They work by preventing the ovaries from releasing eggs (ovulation). They also cause the cervical mucus to thicken, preventing the sperm from entering the uterus. Birth control pills are prescribed by a health care provider.Birth control pills can also be used to treat heavy periods.  Minipill. This type of birth control pill contains only the progesterone hormone. They are taken every day of each month and must be  prescribed by your health care provider.  Birth control patch. The patch contains hormones similar to those in birth control pills. It must be changed once a week and is prescribed by a health care provider.  Vaginal  ring. The ring contains hormones similar to those in birth control pills. It is left in the vagina for 3 weeks, removed for 1 week, and then a new one is put back in place. The patient must be comfortable inserting and removing the ring from the vagina.A health care provider's prescription is necessary.  Emergency contraception. Emergency contraceptives prevent pregnancy after unprotected sexual intercourse. This pill can be taken right after sex or up to 5 days after unprotected sex. It is most effective the sooner you take the pills after having sexual intercourse. Most emergency contraceptive pills are available without a prescription. Check with your pharmacist. Do not use emergency contraception as your only form of birth control. BARRIER METHODS   Female condom. This is a thin sheath (latex or rubber) that is worn over the penis during sexual intercourse. It can be used with spermicide to increase effectiveness.  Female condom. This is a soft, loose-fitting sheath that is put into the vagina before sexual intercourse.  Diaphragm. This is a soft, latex, dome-shaped barrier that must be fitted by a health care provider. It is inserted into the vagina, along with a spermicidal jelly. It is inserted before intercourse. The diaphragm should be left in the vagina for 6 to 8 hours after intercourse.  Cervical cap. This is a round, soft, latex or plastic cup that fits over the cervix and must be fitted by a health care provider. The cap can be left in place for up to 48 hours after intercourse.  Sponge. This is a soft, circular piece of polyurethane foam. The sponge has spermicide in it. It is inserted into the vagina after wetting it and before sexual intercourse.  Spermicides. These are chemicals that kill or block sperm from entering the cervix and uterus. They come in the form of creams, jellies, suppositories, foam, or tablets. They do not require a prescription. They are inserted into the vagina  with an applicator before having sexual intercourse. The process must be repeated every time you have sexual intercourse. INTRAUTERINE CONTRACEPTION  Intrauterine device (IUD). This is a T-shaped device that is put in a woman's uterus during a menstrual period to prevent pregnancy. There are 2 types:  Copper IUD. This type of IUD is wrapped in copper wire and is placed inside the uterus. Copper makes the uterus and fallopian tubes produce a fluid that kills sperm. It can stay in place for 10 years.  Hormone IUD. This type of IUD contains the hormone progestin (synthetic progesterone). The hormone thickens the cervical mucus and prevents sperm from entering the uterus, and it also thins the uterine lining to prevent implantation of a fertilized egg. The hormone can weaken or kill the sperm that get into the uterus. It can stay in place for 3-5 years, depending on which type of IUD is used. PERMANENT METHODS OF CONTRACEPTION  Female tubal ligation. This is when the woman's fallopian tubes are surgically sealed, tied, or blocked to prevent the egg from traveling to the uterus.  Hysteroscopic sterilization. This involves placing a small coil or insert into each fallopian tube. Your doctor uses a technique called hysteroscopy to do the procedure. The device causes scar tissue to form. This results in permanent blockage of the fallopian tubes, so the sperm cannot  fertilize the egg. It takes about 3 months after the procedure for the tubes to become blocked. You must use another form of birth control for these 3 months.  Female sterilization. This is when the female has the tubes that carry sperm tied off (vasectomy).This blocks sperm from entering the vagina during sexual intercourse. After the procedure, the man can still ejaculate fluid (semen). NATURAL PLANNING METHODS  Natural family planning. This is not having sexual intercourse or using a barrier method (condom, diaphragm, cervical cap) on days the  woman could become pregnant.  Calendar method. This is keeping track of the length of each menstrual cycle and identifying when you are fertile.  Ovulation method. This is avoiding sexual intercourse during ovulation.  Symptothermal method. This is avoiding sexual intercourse during ovulation, using a thermometer and ovulation symptoms.  Post-ovulation method. This is timing sexual intercourse after you have ovulated. Regardless of which type or method of contraception you choose, it is important that you use condoms to protect against the transmission of sexually transmitted infections (STIs). Talk with your health care provider about which form of contraception is most appropriate for you. Document Released: 08/10/2005 Document Revised: 08/15/2013 Document Reviewed: 02/02/2013 [facility] Patient Information 2015 [place], [place]. This information is not intended to replace advice given to you by your health care provider. Make sure you discuss any questions you have with your health care provider.

## 2014-10-04 ENCOUNTER — Telehealth: Payer: Self-pay | Admitting: *Deleted

## 2014-10-04 NOTE — Telephone Encounter (Signed)
Pt contacted the clinic stating that she continues to bleed heavily since being seen in the [facility] on 2/7 and she is taking Provera 10 mg once per day.  Request call back from staff to see if there is something else that can be done.   Discussed case with Dr. Roselie Awkward who states for patient to increase does to Provera 10 mg bid.   Attempted to contact patient, no answer, left message on patient's voicemail that the provider recommended her to increase the dose to twice daily and to call back if any further problems.

## 2014-10-05 ENCOUNTER — Telehealth: Payer: Self-pay | Admitting: *Deleted

## 2014-10-05 MED ORDER — MEDROXYPROGESTERONE ACETATE 10 MG PO TABS: 10.0000 mg | ORAL_TABLET | Freq: Two times a day (BID) | ORAL | Status: DC

## 2014-10-05 NOTE — Telephone Encounter (Addendum)
Pt called back with additional question about her medication. She wants to know if it is safe to take with the mini pill since it is progesterone also. I called pt and discussed her concern. She states that she was advised by the pharmacist at the [facility] that she can take both medicines but she wanted to be sure. She also wanted to know if she would need more medication since she only has 4 pills left of the Provera. I consulted with Dr. Roselie Awkward and advised pt that she can take both medicines. He also prescribed additional Provera and wants her to continue taking it twice daily for an additional 20 days. Her Rx has been sent to her pharmacy.  Pt voiced understanding.

## 2014-10-30 ENCOUNTER — Telehealth: Payer: Self-pay | Admitting: *Deleted

## 2014-10-30 NOTE — Telephone Encounter (Addendum)
Pt left message stating that she has some worries that she does not want to leave on the voice mail.  Please call back.   3/9  0900  I returned pt's call and left a message requesting her to call back and state her concerns or questions. Our voice mail is confidential. Also please state whether a detailed message can be left on her voice mail when we call back.

## 2014-10-31 NOTE — Telephone Encounter (Signed)
Discussed with provider Karoline Caldwell- Loletta Specter and called Earlene Plater. We discussed that her partner had the sore on his lips, he did not kiss her at all anywhere. We discussed there is still a risk of transmission although I can not give her any statistics.  Advised her to wait several weeks and see if she has an outbreak. She can get a blood test in about 6 weeks if she would like. We discussed there are meds to take if she or he have frequent outbreaks of cold sore or genital herpes. She voiced understanding.

## 2014-10-31 NOTE — Telephone Encounter (Signed)
Fionna called and left another message stating she is sorry she missed our call. She states her concern is she had oral sex with someone who had a cold sore on their lip. States she is kind of freaked out because she has been reading that it can be transmitted " down there".  States the sore wasn't weeping- it was crusty and drying up.  States she needs some information about this and her risks.

## 2014-11-28 ENCOUNTER — Emergency Department (HOSPITAL_COMMUNITY)
Admission: EM | Admit: 2014-11-28 | Discharge: 2014-11-28 | Disposition: A | Discharge status: Home / Self Care | Payer: Medicaid Other | Attending: Emergency Medicine | Admitting: Emergency Medicine

## 2014-11-28 ENCOUNTER — Encounter (HOSPITAL_COMMUNITY): Payer: Self-pay | Admitting: *Deleted

## 2014-11-28 DIAGNOSIS — Z91040 Latex allergy status: Secondary | ICD-10-CM | POA: Diagnosis not present

## 2014-11-28 DIAGNOSIS — Z880 Allergy status to penicillin: Secondary | ICD-10-CM | POA: Insufficient documentation

## 2014-11-28 DIAGNOSIS — Z79899 Other long term (current) drug therapy: Secondary | ICD-10-CM | POA: Diagnosis not present

## 2014-11-28 DIAGNOSIS — Z3202 Encounter for pregnancy test, result negative: Secondary | ICD-10-CM | POA: Diagnosis not present

## 2014-11-28 DIAGNOSIS — Z792 Long term (current) use of antibiotics: Secondary | ICD-10-CM | POA: Insufficient documentation

## 2014-11-28 DIAGNOSIS — Z87440 Personal history of urinary (tract) infections: Secondary | ICD-10-CM | POA: Insufficient documentation

## 2014-11-28 DIAGNOSIS — R51 Headache: Secondary | ICD-10-CM | POA: Diagnosis present

## 2014-11-28 DIAGNOSIS — R109 Unspecified abdominal pain: Secondary | ICD-10-CM | POA: Diagnosis not present

## 2014-11-28 DIAGNOSIS — J029 Acute pharyngitis, unspecified: Secondary | ICD-10-CM | POA: Diagnosis not present

## 2014-11-28 DIAGNOSIS — Z8619 Personal history of other infectious and parasitic diseases: Secondary | ICD-10-CM | POA: Insufficient documentation

## 2014-11-28 LAB — CBC WITH DIFFERENTIAL/PLATELET
BASOS ABS: 0 10*3/uL (ref 0.0–0.1)
Basophils Relative: 0 % (ref 0–1)
EOS PCT: 9 % — AB (ref 0–5)
Eosinophils Absolute: 0.6 10*3/uL (ref 0.0–0.7)
HCT: 40.5 % (ref 36.0–46.0)
Hemoglobin: 13.4 g/dL (ref 12.0–15.0)
LYMPHS ABS: 2 10*3/uL (ref 0.7–4.0)
LYMPHS PCT: 27 % (ref 12–46)
MCH: 25.8 pg — ABNORMAL LOW (ref 26.0–34.0)
MCHC: 33.1 g/dL (ref 30.0–36.0)
MCV: 78 fL (ref 78.0–100.0)
Monocytes Absolute: 0.5 10*3/uL (ref 0.1–1.0)
Monocytes Relative: 7 % (ref 3–12)
NEUTROS ABS: 4.1 10*3/uL (ref 1.7–7.7)
Neutrophils Relative %: 57 % (ref 43–77)
PLATELETS: 352 10*3/uL (ref 150–400)
RBC: 5.19 MIL/uL — ABNORMAL HIGH (ref 3.87–5.11)
RDW: 14 % (ref 11.5–15.5)
WBC: 7.3 10*3/uL (ref 4.0–10.5)

## 2014-11-28 LAB — LIPASE, BLOOD: Lipase: 27 U/L (ref 11–59)

## 2014-11-28 LAB — URINALYSIS, ROUTINE W REFLEX MICROSCOPIC
Bilirubin Urine: NEGATIVE
GLUCOSE, UA: NEGATIVE mg/dL
Ketones, ur: NEGATIVE mg/dL
LEUKOCYTES UA: NEGATIVE
Nitrite: NEGATIVE
Protein, ur: NEGATIVE mg/dL
Specific Gravity, Urine: 1.023 (ref 1.005–1.030)
Urobilinogen, UA: 4 mg/dL — ABNORMAL HIGH (ref 0.0–1.0)
pH: 7.5 (ref 5.0–8.0)

## 2014-11-28 LAB — WET PREP, GENITAL
CLUE CELLS WET PREP: NONE SEEN
TRICH WET PREP: NONE SEEN
YEAST WET PREP: NONE SEEN

## 2014-11-28 LAB — COMPREHENSIVE METABOLIC PANEL
ALBUMIN: 3.8 g/dL (ref 3.5–5.2)
ALT: 18 U/L (ref 0–35)
AST: 24 U/L (ref 0–37)
Alkaline Phosphatase: 71 U/L (ref 39–117)
Anion gap: 3 — ABNORMAL LOW (ref 5–15)
BUN: <5 mg/dL — ABNORMAL LOW (ref 6–23)
CALCIUM: 9 mg/dL (ref 8.4–10.5)
CO2: 30 mmol/L (ref 19–32)
CREATININE: 0.86 mg/dL (ref 0.50–1.10)
Chloride: 106 mmol/L (ref 96–112)
GFR calc Af Amer: >90 mL/min (ref >90)
GFR, EST NON AFRICAN AMERICAN: 88 mL/min — AB (ref >90)
Glucose, Bld: 123 mg/dL — ABNORMAL HIGH (ref 70–99)
POTASSIUM: 3.7 mmol/L (ref 3.5–5.1)
SODIUM: 139 mmol/L (ref 135–145)
Total Bilirubin: 0.4 mg/dL (ref 0.3–1.2)
Total Protein: 7.8 g/dL (ref 6.0–8.3)

## 2014-11-28 LAB — URINE MICROSCOPIC-ADD ON

## 2014-11-28 LAB — CBG MONITORING, ED: GLUCOSE-CAPILLARY: 86 mg/dL (ref 70–99)

## 2014-11-28 LAB — POC URINE PREG, ED: PREG TEST UR: NEGATIVE

## 2014-11-28 MED ORDER — OXYMETAZOLINE HCL 0.05 % NA SOLN: 1.0000 | Freq: Two times a day (BID) | NASAL | Status: DC

## 2014-11-28 MED ORDER — AZITHROMYCIN 250 MG PO TABS: 250.0000 mg | ORAL_TABLET | Freq: Every day | ORAL | Status: DC

## 2014-11-28 NOTE — ED Notes (Signed)
She has been on the bcp but has not taken the pill consuistantly  No she reports she did not have a period in march

## 2014-11-28 NOTE — Discharge Instructions (Signed)
Tonsillitis Tonsillitis is an infection of the throat that causes the tonsils to become red, tender, and swollen. Tonsils are collections of lymphoid tissue at the back of the throat. Each tonsil has crevices (crypts). Tonsils help fight nose and throat infections and keep infection from spreading to other parts of the body for the first 18 months of life.  CAUSES Sudden (acute) tonsillitis is usually caused by infection with streptococcal bacteria. Long-lasting (chronic) tonsillitis occurs when the crypts of the tonsils become filled with pieces of food and bacteria, which makes it easy for the tonsils to become repeatedly infected. SYMPTOMS  Symptoms of tonsillitis include:  A sore throat, with possible difficulty swallowing.  White patches on the tonsils.  Fever.  Tiredness.  New episodes of snoring during sleep, when you did not snore before.  Small, foul-smelling, yellowish-white pieces of material (tonsilloliths) that you occasionally cough up or spit out. The tonsilloliths can also cause you to have bad breath. DIAGNOSIS Tonsillitis can be diagnosed through a physical exam. Diagnosis can be confirmed with the results of lab tests, including a throat culture. TREATMENT  The goals of tonsillitis treatment include the reduction of the severity and duration of symptoms and prevention of associated conditions. Symptoms of tonsillitis can be improved with the use of steroids to reduce the swelling. Tonsillitis caused by bacteria can be treated with antibiotic medicines. Usually, treatment with antibiotic medicines is started before the cause of the tonsillitis is known. However, if it is determined that the cause is not bacterial, antibiotic medicines will not treat the tonsillitis. If attacks of tonsillitis are severe and frequent, your health care provider may recommend surgery to remove the tonsils (tonsillectomy). HOME CARE INSTRUCTIONS   Rest as much as possible and get plenty of  sleep.  Drink plenty of fluids. While the throat is very sore, eat soft foods or liquids, such as sherbet, soups, or instant breakfast drinks.  Eat frozen ice pops.  Gargle with a warm or cold liquid to help soothe the throat. Mix 1/4 teaspoon of salt and 1/4 teaspoon of baking soda in 8 oz of water. SEEK MEDICAL CARE IF:   Large, tender lumps develop in your neck.  A rash develops.  A green, yellow-brown, or bloody substance is coughed up.  You are unable to swallow liquids or food for 24 hours.  You notice that only one of the tonsils is swollen. SEEK IMMEDIATE MEDICAL CARE IF:   You develop any new symptoms such as vomiting, severe headache, stiff neck, chest pain, or trouble breathing or swallowing.  You have severe throat pain along with drooling or voice changes.  You have severe pain, unrelieved with recommended medications.  You are unable to fully open the mouth.  You develop redness, swelling, or severe pain anywhere in the neck.  You have a fever. MAKE SURE YOU:   Understand these instructions.  Will watch your condition.  Will get help right away if you are not doing well or get worse. Document Released: 05/20/2005 Document Revised: 12/25/2013 Document Reviewed: 01/27/2013 [facility] Patient Information 2015 [place], [place]. This information is not intended to replace advice given to you by your health care provider. Make sure you discuss any questions you have with your health care provider. Dysmenorrhea Menstrual cramps (dysmenorrhea) are caused by the muscles of the uterus tightening (contracting) during a menstrual period. For some women, this discomfort is merely bothersome. For others, dysmenorrhea can be severe enough to interfere with everyday activities for a few days each  month. Primary dysmenorrhea is menstrual cramps that last a couple of days when you start having menstrual periods or soon after. This often begins after a teenager starts having her  period. As a woman gets older or has a baby, the cramps will usually lessen or disappear. Secondary dysmenorrhea begins later in life, lasts longer, and the pain may be stronger than primary dysmenorrhea. The pain may start before the period and last a few days after the period.  CAUSES  Dysmenorrhea is usually caused by an underlying problem, such as:  The tissue lining the uterus grows outside of the uterus in other areas of the body (endometriosis).  The endometrial tissue, which normally lines the uterus, is found in or grows into the muscular walls of the uterus (adenomyosis).  The pelvic blood vessels are engorged with blood just before the menstrual period (pelvic congestive syndrome).  Overgrowth of cells (polyps) in the lining of the uterus or cervix.  Falling down of the uterus (prolapse) because of loose or stretched ligaments.  Depression.  Bladder problems, infection, or inflammation.  Problems with the intestine, a tumor, or irritable bowel syndrome.  Cancer of the female organs or bladder.  A severely tipped uterus.  A very tight opening or closed cervix.  Noncancerous tumors of the uterus (fibroids).  Pelvic inflammatory disease (PID).  Pelvic scarring (adhesions) from a previous surgery.  Ovarian cyst.  An intrauterine device (IUD) used for birth control. RISK FACTORS You may be at greater risk of dysmenorrhea if:  You are younger than age 70.  You started puberty early.  You have irregular or heavy bleeding.  You have never given birth.  You have a family history of this problem.  You are a smoker. SIGNS AND SYMPTOMS   Cramping or throbbing pain in your lower abdomen.  Headaches.  Lower back pain.  Nausea or vomiting.  Diarrhea.  Sweating or dizziness.  Loose stools. DIAGNOSIS  A diagnosis is based on your history, symptoms, physical exam, diagnostic tests, or procedures. Diagnostic tests or procedures may include:  Blood  tests.  Ultrasonography.  An examination of the lining of the uterus (dilation and curettage, D&C).  An examination inside your abdomen or pelvis with a scope (laparoscopy).  X-rays.  CT scan.  MRI.  An examination inside the bladder with a scope (cystoscopy).  An examination inside the intestine or stomach with a scope (colonoscopy, gastroscopy). TREATMENT  Treatment depends on the cause of the dysmenorrhea. Treatment may include:  Pain medicine prescribed by your health care provider.  Birth control pills or an IUD with progesterone hormone in it.  Hormone replacement therapy.  Nonsteroidal anti-inflammatory drugs (NSAIDs). These may help stop the production of prostaglandins.  Surgery to remove adhesions, endometriosis, ovarian cyst, or fibroids.  Removal of the uterus (hysterectomy).  Progesterone shots to stop the menstrual period.  Cutting the nerves on the sacrum that go to the female organs (presacral neurectomy).  Electric current to the sacral nerves (sacral nerve stimulation).  Antidepressant medicine.  Psychiatric therapy, counseling, or group therapy.  Exercise and physical therapy.  Meditation and yoga therapy.  Acupuncture. HOME CARE INSTRUCTIONS   Only take over-the-counter or prescription medicines as directed by your health care provider.  Place a heating pad or hot water bottle on your lower back or abdomen. Do not sleep with the heating pad.  Use aerobic exercises, walking, swimming, biking, and other exercises to help lessen the cramping.  Massage to the lower back or abdomen may help.  Stop smoking.  Avoid alcohol and caffeine. SEEK MEDICAL CARE IF:   Your pain does not get better with medicine.  You have pain with sexual intercourse.  Your pain increases and is not controlled with medicines.  You have abnormal vaginal bleeding with your period.  You develop nausea or vomiting with your period that is not controlled with  medicine. SEEK IMMEDIATE MEDICAL CARE IF:  You pass out.  Document Released: 08/10/2005 Document Revised: 04/12/2013 Document Reviewed: 01/26/2013 [facility] Patient Information 2015 [place], [place]. This information is not intended to replace advice given to you by your health care provider. Make sure you discuss any questions you have with your health care provider.

## 2014-11-28 NOTE — ED Notes (Signed)
The pt  Is c/o multiple symptoms  Headache sinus congestion lower abd pain.  Allergies  And she has been feeling lightheaded with the sinus congestion.  lmp last month

## 2014-11-28 NOTE — ED Provider Notes (Signed)
CSN: [id number]     Arrival date & time 11/28/14  1832 History   First MD Initiated Contact with Patient 11/28/14 1851     Chief Complaint  Patient presents with  . Facial Pain     (Consider location/radiation/quality/duration/timing/severity/associated sxs/prior Treatment) HPI   PCP: No PCP Per Patient Blood pressure 142/80, pulse 101, temperature 98.4 F (36.9 C), temperature source Oral, resp. rate 20, last menstrual period 10/28/2014, SpO2 99 %, unknown if currently breastfeeding.  Kimberly Bennett is a 34 y.o.female with a significant PMH of UTI, chlamydia presents to the ER with complaints of sore throat, sinus tenderness, nasal congestion, and suprapubic and low back pain.   URI sx- pt reports having a sore throat over the past week as well as sinus tenderness and nasal congestion. She reports trying "everything" that she can for symptoms but nothing has been working. She's not had any headache, neck pain, weakness, fatigue. She does endorse having some mild dry cough, without chest pain, wheezing or shortness of breath.  Low back pain and suprapubic back pain - patient reports having abnormal menstrual cycle 2 months ago and being started on Provera and the minipill. She extends up her dosages and has not taken her birth control for the past 2 weeks. She reports being 3 weeks late on her menstrual cycle. She is having cramps in her low back and suprapubic region. She denies having any abdominal pains, nausea, vomiting or diarrhea.  Past Medical History  Diagnosis Date  . Urinary tract infection   . Chlamydia   . Trichomonas   . Abnormal Pap smear of cervix     colposcopy 2012, Jan  . Pregnancy induced hypertension    Past Surgical History  Procedure Laterality Date  . Colposcopy    . Dilation and curettage of uterus     No family history on file. History  Substance Use Topics  . Smoking status: Never Smoker   . Smokeless tobacco: Never Used  . Alcohol Use: No   OB  History    Gravida Para Term Preterm AB TAB SAB Ectopic Multiple Living   6 4 3 1 2 1 1  0 0 4     Review of Systems  10 Systems reviewed and are negative for acute change except as noted in the HPI.   Allergies  Penicillins and Latex  Home Medications   Prior to Admission medications   Medication Sig Start Date End Date Taking? Authorizing Provider  diphenhydrAMINE (BENADRYL) 25 MG tablet Take 25 mg by mouth every 6 (six) hours as needed for allergies.   Yes Historical Provider, MD  loratadine (CLARITIN) 10 MG tablet Take 10 mg by mouth daily.   Yes Historical Provider, MD  azithromycin (ZITHROMAX) 250 MG tablet Take 1 tablet (250 mg total) by mouth daily. Take first 2 tablets together, then 1 every day until finished. 11/28/14   Manuel Lawhead Carlota Raspberry, PA-C  medroxyPROGESTERone (PROVERA) 10 MG tablet Take 1 tablet (10 mg total) by mouth 2 (two) times daily. Patient not taking: Reported on 11/28/2014 10/05/14 10/05/15  Woodroe Mode, MD  oxymetazoline [facility] NASAL SPRAY) 0.05 % nasal spray Place 1 spray into both nostrils 2 (two) times daily. 11/28/14   Zakyra [place] Carlota Raspberry, PA-C   BP 124/75 mmHg  Pulse 88  Temp(Src) 98.4 F (36.9 C) (Oral)  Resp 20  SpO2 100%  LMP 10/28/2014 Physical Exam  Constitutional: She appears well-developed and well-nourished. No distress.  HENT:  Head: Normocephalic and atraumatic.  Right Ear:  Tympanic membrane and ear canal normal.  Left Ear: Tympanic membrane and ear canal normal.  Nose: Rhinorrhea present. Right sinus exhibits maxillary sinus tenderness and frontal sinus tenderness. Left sinus exhibits maxillary sinus tenderness and frontal sinus tenderness.  Mouth/Throat: No trismus in the jaw. Oropharyngeal exudate present. No posterior oropharyngeal edema or posterior oropharyngeal erythema.  Eyes: Pupils are equal, round, and reactive to light.  Neck: Normal range of motion. Neck supple. No spinous process tenderness and no muscular tenderness present.   Cardiovascular: Normal rate and regular rhythm.   Pulmonary/Chest: Effort normal and breath sounds normal. She has no decreased breath sounds. She has no wheezes. She has no rhonchi.  Abdominal: Soft. Bowel sounds are normal. She exhibits no distension and no fluid wave. There is no tenderness. There is no rigidity, no rebound and no guarding.  Genitourinary: Vagina normal and uterus normal. Cervix exhibits no motion tenderness, no discharge and no friability. Right adnexum displays no mass, no tenderness and no fullness. Left adnexum displays no mass, no tenderness and no fullness.  Neurological: She is alert.  Skin: Skin is warm and dry.  Nursing note and vitals reviewed.   ED Course  Procedures (including critical care time) Labs Review Labs Reviewed  WET PREP, GENITAL - Abnormal; Notable for the following:    WBC, Wet Prep HPF POC FEW (*)    All other components within normal limits  CBC WITH DIFFERENTIAL/PLATELET - Abnormal; Notable for the following:    RBC 5.19 (*)    MCH 25.8 (*)    Eosinophils Relative 9 (*)    All other components within normal limits  COMPREHENSIVE METABOLIC PANEL - Abnormal; Notable for the following:    Glucose, Bld 123 (*)    BUN <5 (*)    GFR calc non Af Amer 88 (*)    Anion gap 3 (*)    All other components within normal limits  URINALYSIS, ROUTINE W REFLEX MICROSCOPIC - Abnormal; Notable for the following:    Hgb urine dipstick TRACE (*)    Urobilinogen, UA 4.0 (*)    All other components within normal limits  URINE MICROSCOPIC-ADD ON - Abnormal; Notable for the following:    Squamous Epithelial / LPF FEW (*)    All other components within normal limits  LIPASE, BLOOD  POC URINE PREG, ED  CBG MONITORING, ED  GC/CHLAMYDIA PROBE AMP ([place])    Imaging Review No results found.   EKG Interpretation None      MDM   Final diagnoses:  Pharyngitis  Abdominal cramping   The patients wet prep, urine preg, CBG and urinalysis are  unremarkable. CBC, lipase and CMP are also not impressive. The patient does have tonsillitis and a penicillin allergy, with treat with Afrin and a Z-pack. Pts low back pain and suprapubic cramps are most likely related to oncoming menstrual. Pt is resting comfortably in exam room and shows no signs of distress.  34 y.o.Karen Kitchens Leatham's evaluation in the Emergency Department is complete. It has been determined that no acute conditions requiring further emergency intervention are present at this time. The patient/guardian have been advised of the diagnosis and plan. We have discussed signs and symptoms that warrant return to the ED, such as changes or worsening in symptoms.  Vital signs are stable at discharge. Filed Vitals:   11/28/14 2030  BP: 124/75  Pulse: 88  Temp:   Resp:     Patient/guardian has voiced understanding and agreed to follow-up with the PCP  or specialist.      Delos Haring, PA-C 11/28/14 [id number]  Milton Ferguson, MD 11/28/14 [phone]

## 2014-11-29 LAB — GC/CHLAMYDIA PROBE AMP (~~LOC~~) NOT AT ARMC
CHLAMYDIA, DNA PROBE: NEGATIVE
NEISSERIA GONORRHEA: NEGATIVE

## 2014-12-02 ENCOUNTER — Other Ambulatory Visit: Payer: Self-pay | Admitting: Obstetrics & Gynecology

## 2015-04-26 ENCOUNTER — Inpatient Hospital Stay (HOSPITAL_COMMUNITY): Payer: Medicaid Other

## 2015-04-26 ENCOUNTER — Encounter (HOSPITAL_COMMUNITY): Payer: Self-pay

## 2015-04-26 ENCOUNTER — Inpatient Hospital Stay (HOSPITAL_COMMUNITY)
Admission: AD | Admit: 2015-04-26 | Discharge: 2015-04-26 | Disposition: A | Discharge status: Home / Self Care | Payer: Self-pay | Source: Ambulatory Visit | Attending: Obstetrics & Gynecology | Admitting: Obstetrics & Gynecology

## 2015-04-26 DIAGNOSIS — Z3A01 Less than 8 weeks gestation of pregnancy: Secondary | ICD-10-CM | POA: Insufficient documentation

## 2015-04-26 DIAGNOSIS — O3680X0 Pregnancy with inconclusive fetal viability, not applicable or unspecified: Secondary | ICD-10-CM

## 2015-04-26 DIAGNOSIS — O4691 Antepartum hemorrhage, unspecified, first trimester: Secondary | ICD-10-CM

## 2015-04-26 DIAGNOSIS — O209 Hemorrhage in early pregnancy, unspecified: Secondary | ICD-10-CM | POA: Insufficient documentation

## 2015-04-26 DIAGNOSIS — Z880 Allergy status to penicillin: Secondary | ICD-10-CM | POA: Insufficient documentation

## 2015-04-26 LAB — URINE MICROSCOPIC-ADD ON

## 2015-04-26 LAB — URINALYSIS, ROUTINE W REFLEX MICROSCOPIC
Glucose, UA: NEGATIVE mg/dL
KETONES UR: NEGATIVE mg/dL
Leukocytes, UA: NEGATIVE
Nitrite: NEGATIVE
PH: 6 (ref 5.0–8.0)
Protein, ur: NEGATIVE mg/dL
Urobilinogen, UA: 1 mg/dL (ref 0.0–1.0)

## 2015-04-26 LAB — HCG, QUANTITATIVE, PREGNANCY: HCG, BETA CHAIN, QUANT, S: 59 m[IU]/mL — AB (ref <5)

## 2015-04-26 LAB — WET PREP, GENITAL
TRICH WET PREP: NONE SEEN
YEAST WET PREP: NONE SEEN

## 2015-04-26 LAB — CBC
HCT: 39.5 % (ref 36.0–46.0)
Hemoglobin: 12.9 g/dL (ref 12.0–15.0)
MCH: 26.3 pg (ref 26.0–34.0)
MCHC: 32.7 g/dL (ref 30.0–36.0)
MCV: 80.6 fL (ref 78.0–100.0)
PLATELETS: 294 10*3/uL (ref 150–400)
RBC: 4.9 MIL/uL (ref 3.87–5.11)
RDW: 14.2 % (ref 11.5–15.5)
WBC: 4.7 10*3/uL (ref 4.0–10.5)

## 2015-04-26 LAB — POCT PREGNANCY, URINE: Preg Test, Ur: POSITIVE — AB

## 2015-04-26 NOTE — MAU Note (Signed)
Pos HPT on 8/26, went to BR this morning, states the toilet was "full of blood."  Denies pain.

## 2015-04-26 NOTE — MAU Provider Note (Signed)
Chief Complaint: Vaginal Bleeding   First Provider Initiated Contact with Patient 04/26/15 1244      SUBJECTIVE HPI: JUDEE HENNICK is a 34 y.o. [id number] at [redacted]w[redacted]d by LMP who presents to maternity admissions reporting onset of heavy vaginal bleeding this morning, filling toilet with blood x1 then requiring a pad since then.  She reports soaking 2 full pads in last 4-5 hours.  She is concerned she is having a miscarriage but reports she is not having any pain.   She denies vaginal itching/burning, urinary symptoms, h/a, dizziness, n/v, or fever/chills.     Vaginal Bleeding The patient's primary symptoms include vaginal bleeding. The patient's pertinent negatives include no pelvic pain or vaginal discharge. This is a new problem. The current episode started today. The problem occurs constantly. The problem has been gradually improving. The patient is experiencing no pain. She is pregnant. Pertinent negatives include no abdominal pain, back pain, chills, constipation, diarrhea, dysuria, fever, flank pain, frequency, headaches, nausea, urgency or vomiting. The vaginal bleeding is typical of menses. She has not been passing clots. She has not been passing tissue. Nothing aggravates the symptoms. She has tried nothing for the symptoms. She is sexually active.    Past Medical History  Diagnosis Date  . Urinary tract infection   . Chlamydia   . Trichomonas   . Abnormal Pap smear of cervix     colposcopy 2012, Jan  . Pregnancy induced hypertension    Past Surgical History  Procedure Laterality Date  . Colposcopy    . Dilation and curettage of uterus     Social History   Social History  . Marital Status: Single    Spouse Name: N/A  . Number of Children: N/A  . Years of Education: N/A   Occupational History  . Not on file.   Social History Main Topics  . Smoking status: Never Smoker   . Smokeless tobacco: Never Used  . Alcohol Use: No  . Drug Use: No  . Sexual Activity: Yes    Birth  Control/ Protection: Condom   Other Topics Concern  . Not on file   Social History Narrative   No current facility-administered medications on file prior to encounter.   No current outpatient prescriptions on file prior to encounter.   Allergies  Allergen Reactions  . Penicillins Hives and Itching  . Latex Rash    ROS:  Review of Systems  Constitutional: Negative for fever, chills and fatigue.  HENT: Negative for sinus pressure.   Eyes: Negative for photophobia.  Respiratory: Negative for shortness of breath.   Cardiovascular: Negative for chest pain.  Gastrointestinal: Negative for nausea, vomiting, abdominal pain, diarrhea and constipation.  Genitourinary: Positive for vaginal bleeding. Negative for dysuria, urgency, frequency, flank pain, vaginal discharge, difficulty urinating, vaginal pain and pelvic pain.  Musculoskeletal: Negative for back pain and neck pain.  Neurological: Negative for dizziness, weakness and headaches.  Psychiatric/Behavioral: Negative.      I have reviewed patient's Past Medical Hx, Surgical Hx, Family Hx, Social Hx, medications and allergies.   Physical Exam   Patient Vitals for the past 24 hrs:  BP Temp Temp src Pulse Resp Height Weight  04/26/15 1052 118/86 mmHg 98.6 F (37 C) Oral 89 18 5\' 4"  (1.626 m) 60.238 kg (132 lb 12.8 oz)   Constitutional: Well-developed, well-nourished female in no acute distress.  Cardiovascular: normal rate Respiratory: normal effort GI: Abd soft, non-tender. Pos BS x 4 MS: Extremities nontender, no edema, normal ROM Neurologic:  Alert and oriented x 4.  GU: Neg CVAT.  PELVIC EXAM: Cervix pink, visually closed, without lesion, moderate amount dark red bleeding, vaginal walls and external genitalia normal Bimanual exam: Cervix 0/long/high, firm, anterior, neg CMT, uterus nontender, nonenlarged, adnexa without tenderness, enlargement, or mass  LAB RESULTS Results for orders placed or performed during the  hospital encounter of 04/26/15 (from the past 24 hour(s))  Urinalysis, Routine w reflex microscopic (not at [facility])     Status: Abnormal   Collection Time: 04/26/15 10:56 AM  Result Value Ref Range   Color, Urine YELLOW YELLOW   APPearance HAZY (A) CLEAR   Specific Gravity, Urine >1.030 (H) 1.005 - 1.030   pH 6.0 5.0 - 8.0   Glucose, UA NEGATIVE NEGATIVE mg/dL   Hgb urine dipstick LARGE (A) NEGATIVE   Bilirubin Urine SMALL (A) NEGATIVE   Ketones, ur NEGATIVE NEGATIVE mg/dL   Protein, ur NEGATIVE NEGATIVE mg/dL   Urobilinogen, UA 1.0 0.0 - 1.0 mg/dL   Nitrite NEGATIVE NEGATIVE   Leukocytes, UA NEGATIVE NEGATIVE  Urine microscopic-add on     Status: Abnormal   Collection Time: 04/26/15 10:56 AM  Result Value Ref Range   Squamous Epithelial / LPF MANY (A) RARE   WBC, UA 0-2 <3 WBC/hpf   RBC / HPF 11-20 <3 RBC/hpf   Bacteria, UA MANY (A) RARE   Urine-Other MUCOUS PRESENT   Pregnancy, urine POC     Status: Abnormal   Collection Time: 04/26/15 10:59 AM  Result Value Ref Range   Preg Test, Ur POSITIVE (A) NEGATIVE  CBC     Status: None   Collection Time: 04/26/15 12:00 PM  Result Value Ref Range   WBC 4.7 4.0 - 10.5 K/uL   RBC 4.90 3.87 - 5.11 MIL/uL   Hemoglobin 12.9 12.0 - 15.0 g/dL   HCT 39.5 36.0 - 46.0 %   MCV 80.6 78.0 - 100.0 fL   MCH 26.3 26.0 - 34.0 pg   MCHC 32.7 30.0 - 36.0 g/dL   RDW 14.2 11.5 - 15.5 %   Platelets 294 150 - 400 K/uL  hCG, quantitative, pregnancy     Status: Abnormal   Collection Time: 04/26/15 12:01 PM  Result Value Ref Range   hCG, Beta Chain, Quant, S 59 (H) <5 mIU/mL  Wet prep, genital     Status: Abnormal   Collection Time: 04/26/15 12:08 PM  Result Value Ref Range   Yeast Wet Prep HPF POC NONE SEEN NONE SEEN   Trich, Wet Prep NONE SEEN NONE SEEN   Clue Cells Wet Prep HPF POC FEW (A) NONE SEEN   WBC, Wet Prep HPF POC FEW (A) NONE SEEN       IMAGING US Ob Comp Less 14 Wks  04/26/2015   CLINICAL DATA:  34 year old pregnant female with  moderate vaginal bleeding this morning. Quantitative beta HCG 59.  LMP 03/25/2015, EDC by LMP: 12/30/2015, projecting to an expected gestational age of [redacted] weeks 4 days  EXAM: OBSTETRIC <14 WK [place] AND TRANSVAGINAL OB US  TECHNIQUE: Both transabdominal and transvaginal ultrasound examinations were performed for complete evaluation of the gestation as well as the maternal uterus, adnexal regions, and pelvic cul-de-sac. Transvaginal technique was performed to assess early pregnancy.  COMPARISON:  No prior scans from this gestation. 09/30/2014 pelvic sonogram.  FINDINGS: The anteverted anteflexed uterus is normal in size and configuration, with no uterine fibroids or other myometrial abnormalities. The endometrium measures 5 mm in bilayer thickness. No gestational sac is demonstrated  in the endometrial cavity. No endometrial cavity fluid or focal endometrial lesion is demonstrated.  The maternal right ovary measures 2.3 x 1.6 x 1.6 cm. The maternal left ovary measures 2.1 x 1.3 x 1.5 cm. There are no suspicious ovarian or adnexal masses. No corpus luteum is demonstrated in the ovaries. No abnormal free fluid is seen in the pelvis.  IMPRESSION: Non-localization of the pregnancy at sonography. The sonographic differential diagnosis includes a spontaneous abortion (favored given the absence of a corpus luteum and the thin endometrium), an intrauterine gestation too early to visualize (possible given the low beta HCG level of 59 and the expected gestational age of [redacted] weeks 4 days by provided menstrual dating) or less likely an occult ectopic gestation. Recommend close clinical follow-up with serial serum beta HCG monitoring and follow-up pelvic sonography in 2 weeks if the beta HCG levels rise.   Electronically Signed   By: Ilona Sorrel M.D.   On: 04/26/2015 14:37   US Ob Transvaginal  04/26/2015   CLINICAL DATA:  34 year old pregnant female with moderate vaginal bleeding this morning. Quantitative beta HCG 59.  LMP  03/25/2015, EDC by LMP: 12/30/2015, projecting to an expected gestational age of [redacted] weeks 4 days  EXAM: OBSTETRIC <14 WK [place] AND TRANSVAGINAL OB US  TECHNIQUE: Both transabdominal and transvaginal ultrasound examinations were performed for complete evaluation of the gestation as well as the maternal uterus, adnexal regions, and pelvic cul-de-sac. Transvaginal technique was performed to assess early pregnancy.  COMPARISON:  No prior scans from this gestation. 09/30/2014 pelvic sonogram.  FINDINGS: The anteverted anteflexed uterus is normal in size and configuration, with no uterine fibroids or other myometrial abnormalities. The endometrium measures 5 mm in bilayer thickness. No gestational sac is demonstrated in the endometrial cavity. No endometrial cavity fluid or focal endometrial lesion is demonstrated.  The maternal right ovary measures 2.3 x 1.6 x 1.6 cm. The maternal left ovary measures 2.1 x 1.3 x 1.5 cm. There are no suspicious ovarian or adnexal masses. No corpus luteum is demonstrated in the ovaries. No abnormal free fluid is seen in the pelvis.  IMPRESSION: Non-localization of the pregnancy at sonography. The sonographic differential diagnosis includes a spontaneous abortion (favored given the absence of a corpus luteum and the thin endometrium), an intrauterine gestation too early to visualize (possible given the low beta HCG level of 59 and the expected gestational age of [redacted] weeks 4 days by provided menstrual dating) or less likely an occult ectopic gestation. Recommend close clinical follow-up with serial serum beta HCG monitoring and follow-up pelvic sonography in 2 weeks if the beta HCG levels rise.   Electronically Signed   By: Ilona Sorrel M.D.   On: 04/26/2015 14:37    MAU Management/MDM: Ordered labs and reviewed results.  Pt stable at time of discharge.  ASSESSMENT 1. Vaginal bleeding in pregnancy, first trimester   2. Pregnancy of unknown anatomic location     PLAN Discharge home with  ectopic and bleeding precautions Return to MAU in 48 hours for repeat quant hcg or sooner as needed for emergencies    Medication List    STOP taking these medications        azithromycin 250 MG tablet  Commonly known as:  ZITHROMAX     medroxyPROGESTERone 10 MG tablet  Commonly known as:  PROVERA     oxymetazoline 0.05 % nasal spray  Commonly known as:  AFRIN NASAL SPRAY       Follow-up Information  Follow up with [facility].   Why:  In 48 hours for repeat labs or sooner as needed   Contact information:   [address] [phone] [place] Certified Nurse-Midwife 04/26/2015  4:30 PM

## 2015-04-26 NOTE — Discharge Instructions (Signed)
Vaginal Bleeding During Pregnancy, First Trimester  A small amount of bleeding (spotting) from the vagina is relatively common in early pregnancy. It usually stops on its own. Various things may cause bleeding or spotting in early pregnancy. Some bleeding may be related to the pregnancy, and some may not. In most cases, the bleeding is normal and is not a problem. However, bleeding can also be a sign of something serious. Be sure to tell your health care provider about any vaginal bleeding right away.  Some possible causes of vaginal bleeding during the first trimester include:  · Infection or inflammation of the cervix.  · Growths (polyps) on the cervix.  · Miscarriage or threatened miscarriage.  · Pregnancy tissue has developed outside of the uterus and in a fallopian tube (tubal pregnancy).  · Tiny cysts have developed in the uterus instead of pregnancy tissue (molar pregnancy).  HOME CARE INSTRUCTIONS   Watch your condition for any changes. The following actions may help to lessen any discomfort you are feeling:  · Follow your health care provider's instructions for limiting your activity. If your health care provider orders bed rest, you may need to stay in bed and only get up to use the bathroom. However, your health care provider may allow you to continue light activity.  · If needed, make plans for someone to help with your regular activities and responsibilities while you are on bed rest.  · Keep track of the number of pads you use each day, how often you change pads, and how soaked (saturated) they are. Write this down.  · Do not use tampons. Do not douche.  · Do not have sexual intercourse or orgasms until approved by your health care provider.  · If you pass any tissue from your vagina, save the tissue so you can show it to your health care provider.  · Only take over-the-counter or prescription medicines as directed by your health care provider.  · Do not take aspirin because it can make you  bleed.  · Keep all follow-up appointments as directed by your health care provider.  SEEK MEDICAL CARE IF:  · You have any vaginal bleeding during any part of your pregnancy.  · You have cramps or labor pains.  · You have a fever, not controlled by medicine.  SEEK IMMEDIATE MEDICAL CARE IF:   · You have severe cramps in your back or belly (abdomen).  · You pass large clots or tissue from your vagina.  · Your bleeding increases.  · You feel light-headed or weak, or you have fainting episodes.  · You have chills.  · You are leaking fluid or have a gush of fluid from your vagina.  · You pass out while having a bowel movement.  MAKE SURE YOU:  · Understand these instructions.  · Will watch your condition.  · Will get help right away if you are not doing well or get worse.  Document Released: 05/20/2005 Document Revised: 08/15/2013 Document Reviewed: 04/17/2013  Exit[employer]® Patient Information ©2015 [employer]. This information is not intended to replace advice given to you by your health care provider. Make sure you discuss any questions you have with your health care provider.

## 2015-04-27 LAB — HIV ANTIBODY (ROUTINE TESTING W REFLEX): HIV Screen 4th Generation wRfx: NONREACTIVE

## 2015-04-28 ENCOUNTER — Inpatient Hospital Stay (HOSPITAL_COMMUNITY)
Admission: AD | Admit: 2015-04-28 | Discharge: 2015-04-28 | Disposition: A | Discharge status: Home / Self Care | Payer: Medicaid Other | Source: Ambulatory Visit | Attending: Obstetrics and Gynecology | Admitting: Obstetrics and Gynecology

## 2015-04-28 DIAGNOSIS — O039 Complete or unspecified spontaneous abortion without complication: Secondary | ICD-10-CM

## 2015-04-28 DIAGNOSIS — Z3A01 Less than 8 weeks gestation of pregnancy: Secondary | ICD-10-CM | POA: Insufficient documentation

## 2015-04-28 DIAGNOSIS — O3680X0 Pregnancy with inconclusive fetal viability, not applicable or unspecified: Secondary | ICD-10-CM

## 2015-04-28 DIAGNOSIS — O209 Hemorrhage in early pregnancy, unspecified: Secondary | ICD-10-CM | POA: Insufficient documentation

## 2015-04-28 LAB — HCG, QUANTITATIVE, PREGNANCY: hCG, Beta Chain, Quant, S: 23 m[IU]/mL — ABNORMAL HIGH (ref <5)

## 2015-04-28 NOTE — MAU Provider Note (Signed)
  History     CSN: [id number]  Arrival date and time: 04/28/15 1658   First Provider Initiated Contact with Patient 04/28/15 1827      Chief Complaint  Patient presents with  . Repeat BHCG    HPI Kimberly Bennett 34 y.o. [id number] @[redacted]w[redacted]d  per LMP presents for f/u quant HCG.  She states her bleeding has diminished to almost nothing.  She denies abdominal pain, fever, weakness.   OB History    Gravida Para Term Preterm AB TAB SAB Ectopic Multiple Living   7 4 3 1 2 1 1  0 0 4      Past Medical History  Diagnosis Date  . Urinary tract infection   . Chlamydia   . Trichomonas   . Abnormal Pap smear of cervix     colposcopy 2012, Jan  . Pregnancy induced hypertension     Past Surgical History  Procedure Laterality Date  . Colposcopy    . Dilation and curettage of uterus      No family history on file.  Social History  Substance Use Topics  . Smoking status: Never Smoker   . Smokeless tobacco: Never Used  . Alcohol Use: No    Allergies:  Allergies  Allergen Reactions  . Penicillins Hives and Itching  . Latex Rash    No prescriptions prior to admission    ROS Pertinent ROS in HPI.  All other systems are negative.   Physical Exam   Blood pressure 123/90, pulse 67, temperature 98.9 F (37.2 C), temperature source Oral, resp. rate 16, last menstrual period 03/25/2015, not currently breastfeeding.  Physical Exam  Constitutional: She is oriented to person, place, and time. She appears well-developed and well-nourished.  Eyes: EOM are normal.  Neck: Normal range of motion.  Cardiovascular: Normal rate.   Respiratory: No respiratory distress.  Neurological: She is alert and oriented to person, place, and time.  Psychiatric: She has a normal mood and affect. Her behavior is normal.   Labs:  HCG 9/2: 59 HCG 9/4: 23 HCG 9/6: 8 MAU Course  Procedures  MDM Quant HCG continues to decrease.  Symptoms decreased to absent.  Failed pregnancy  Assessment and Plan   A:  1. Pregnancy of unknown anatomic location   2.  Failed Pregnancy/SAB  P: Discharge to home F/u 1 week in clinic Patient may return to MAU as needed or if her condition were to change or worsen   Paticia Stack 04/28/2015, 6:31 PM

## 2015-04-28 NOTE — MAU Note (Signed)
Pt here for repeat BHCG, denies pain. States bleeding was worse yesterday with clots, however has tapered off today.

## 2015-04-30 ENCOUNTER — Inpatient Hospital Stay (HOSPITAL_COMMUNITY)
Admission: AD | Admit: 2015-04-30 | Discharge: 2015-04-30 | Discharge status: Against Medical Advice | Payer: Medicaid Other | Source: Ambulatory Visit | Attending: Obstetrics & Gynecology | Admitting: Obstetrics & Gynecology

## 2015-04-30 DIAGNOSIS — Z3A00 Weeks of gestation of pregnancy not specified: Secondary | ICD-10-CM | POA: Insufficient documentation

## 2015-04-30 DIAGNOSIS — O209 Hemorrhage in early pregnancy, unspecified: Secondary | ICD-10-CM | POA: Insufficient documentation

## 2015-04-30 LAB — GC/CHLAMYDIA PROBE AMP (~~LOC~~) NOT AT ARMC
CHLAMYDIA, DNA PROBE: NEGATIVE
Neisseria Gonorrhea: NEGATIVE

## 2015-04-30 LAB — HCG, QUANTITATIVE, PREGNANCY: hCG, Beta Chain, Quant, S: 8 m[IU]/mL — ABNORMAL HIGH (ref <5)

## 2015-04-30 NOTE — MAU Note (Addendum)
Pt called to triage by NP, not in lobby.  Pt told registration staff that she could not wait for lab results.

## 2015-04-30 NOTE — MAU Note (Signed)
Pt here for F/U BHCG.  Pt denies pain, states she is still bleeding.

## 2015-08-01 ENCOUNTER — Inpatient Hospital Stay (HOSPITAL_COMMUNITY)
Admission: AD | Admit: 2015-08-01 | Discharge: 2015-08-01 | Disposition: A | Discharge status: Home / Self Care | Payer: Medicaid Other | Source: Ambulatory Visit | Attending: Obstetrics and Gynecology | Admitting: Obstetrics and Gynecology

## 2015-08-01 ENCOUNTER — Inpatient Hospital Stay (HOSPITAL_COMMUNITY): Payer: Medicaid Other

## 2015-08-01 ENCOUNTER — Encounter (HOSPITAL_COMMUNITY): Payer: Self-pay | Admitting: *Deleted

## 2015-08-01 DIAGNOSIS — Z3A01 Less than 8 weeks gestation of pregnancy: Secondary | ICD-10-CM | POA: Insufficient documentation

## 2015-08-01 DIAGNOSIS — R1084 Generalized abdominal pain: Secondary | ICD-10-CM

## 2015-08-01 DIAGNOSIS — R112 Nausea with vomiting, unspecified: Secondary | ICD-10-CM

## 2015-08-01 DIAGNOSIS — Z880 Allergy status to penicillin: Secondary | ICD-10-CM | POA: Diagnosis not present

## 2015-08-01 DIAGNOSIS — O468X1 Other antepartum hemorrhage, first trimester: Secondary | ICD-10-CM

## 2015-08-01 DIAGNOSIS — O418X10 Other specified disorders of amniotic fluid and membranes, first trimester, not applicable or unspecified: Secondary | ICD-10-CM

## 2015-08-01 DIAGNOSIS — O26891 Other specified pregnancy related conditions, first trimester: Secondary | ICD-10-CM | POA: Insufficient documentation

## 2015-08-01 DIAGNOSIS — R109 Unspecified abdominal pain: Secondary | ICD-10-CM | POA: Insufficient documentation

## 2015-08-01 DIAGNOSIS — O219 Vomiting of pregnancy, unspecified: Secondary | ICD-10-CM | POA: Insufficient documentation

## 2015-08-01 DIAGNOSIS — O26899 Other specified pregnancy related conditions, unspecified trimester: Secondary | ICD-10-CM

## 2015-08-01 LAB — URINALYSIS, ROUTINE W REFLEX MICROSCOPIC
BILIRUBIN URINE: NEGATIVE
Glucose, UA: NEGATIVE mg/dL
KETONES UR: 15 mg/dL — AB
LEUKOCYTES UA: NEGATIVE
NITRITE: NEGATIVE
PH: 5.5 (ref 5.0–8.0)
PROTEIN: 100 mg/dL — AB
Specific Gravity, Urine: >1.03 — ABNORMAL HIGH (ref 1.005–1.030)

## 2015-08-01 LAB — URINE MICROSCOPIC-ADD ON

## 2015-08-01 LAB — COMPREHENSIVE METABOLIC PANEL
ALT: 10 U/L — ABNORMAL LOW (ref 14–54)
AST: 14 U/L — ABNORMAL LOW (ref 15–41)
Albumin: 4.2 g/dL (ref 3.5–5.0)
Alkaline Phosphatase: 46 U/L (ref 38–126)
Anion gap: 10 (ref 5–15)
BUN: 5 mg/dL — ABNORMAL LOW (ref 6–20)
CO2: 28 mmol/L (ref 22–32)
Calcium: 10 mg/dL (ref 8.9–10.3)
Chloride: 101 mmol/L (ref 101–111)
Creatinine, Ser: 0.48 mg/dL (ref 0.44–1.00)
GFR calc Af Amer: >60 mL/min (ref >60)
GFR calc non Af Amer: >60 mL/min (ref >60)
Glucose, Bld: 80 mg/dL (ref 65–99)
Potassium: 3.7 mmol/L (ref 3.5–5.1)
Sodium: 139 mmol/L (ref 135–145)
Total Bilirubin: 0.5 mg/dL (ref 0.3–1.2)
Total Protein: 7.6 g/dL (ref 6.5–8.1)

## 2015-08-01 LAB — POCT PREGNANCY, URINE: Preg Test, Ur: POSITIVE — AB

## 2015-08-01 LAB — CBC
HCT: 38 % (ref 36.0–46.0)
Hemoglobin: 12.8 g/dL (ref 12.0–15.0)
MCH: 26.4 pg (ref 26.0–34.0)
MCHC: 33.7 g/dL (ref 30.0–36.0)
MCV: 78.4 fL (ref 78.0–100.0)
Platelets: 260 10*3/uL (ref 150–400)
RBC: 4.85 MIL/uL (ref 3.87–5.11)
RDW: 14 % (ref 11.5–15.5)
WBC: 8.7 10*3/uL (ref 4.0–10.5)

## 2015-08-01 LAB — HCG, QUANTITATIVE, PREGNANCY: hCG, Beta Chain, Quant, S: 51687 m[IU]/mL — ABNORMAL HIGH (ref <5)

## 2015-08-01 MED ORDER — PROMETHAZINE HCL 25 MG RE SUPP: 25.0000 mg | Freq: Four times a day (QID) | RECTAL | Status: DC | PRN

## 2015-08-01 MED ORDER — PROMETHAZINE HCL 25 MG PO TABS: 25.0000 mg | ORAL_TABLET | Freq: Four times a day (QID) | ORAL | Status: DC | PRN

## 2015-08-01 MED ORDER — LACTATED RINGERS IV BOLUS (SEPSIS)
1000.0000 mL | Freq: Once | INTRAVENOUS | Status: AC
  Administered 2015-08-01: 1000 mL via INTRAVENOUS

## 2015-08-01 NOTE — MAU Note (Signed)
C/o N&V for past 5 days; c/o weakness for past 5 days;had positive home pregnancy on 12/30/1; got a depo shot on 07/04/2015;

## 2015-08-01 NOTE — MAU Provider Note (Signed)
Chief Complaint: Emesis During Pregnancy   First Provider Initiated Contact with Patient 08/01/15 1737      SUBJECTIVE HPI: Kimberly Bennett is a 34 y.o. [id number] at [redacted]w[redacted]d by LMP who presents to Maternity Admissions reporting low abdominal cramping today and nausea, vomiting and weakness since 07/27/15. Vomits every time she tries to eat. Mild improvement with eating bland foods. Hasn't Tried any medicine.  Patient's last menstrual period was 06/20/2015. Received Depo 07/04/15. Positive home pregnancy test positive pregnancy test in MAU. Has not had any blood work or ultrasounds this pregnancy. Had miscarriage in September. Last Quant in Epic done 04/30/2015 was 8. Had GC/chlamydia cultures, wet prep at [facility] in November. Treated for BV. Declined HIV at that time. Declines repeat cultures, wet prep today but does want HIV testing.  Location: suprapubic Quality: Cramping Severity: 3/10 on pain scale Duration: Less than 24 hours Course: improving Context: none Timing: intermittent Modifying factors: none. Hasn't tried anything for pain.  Associated signs and symptoms: Pos for N/V. Negative for fever, chills, diarrhea, constipation, vaginal bleeding, vaginal discharge.  Past Medical History  Diagnosis Date  . Urinary tract infection   . Chlamydia   . Trichomonas   . Abnormal Pap smear of cervix     colposcopy 2012, Jan  . Pregnancy induced hypertension    OB History  Gravida Para Term Preterm AB SAB TAB Ectopic Multiple Living  8 4 3 1 2 1 1  0 0 4    # Outcome Date GA Lbr Len/2nd Weight Sex Delivery Anes PTL Lv  8 Current           7 Term 10/27/11 [redacted]w[redacted]d 05:56 / 00:09 7 lb 14.6 oz (3.589 kg) M Vag-Spont EPI  Y  6 Gravida           5 SAB           4 Term           3 Term           2 Preterm           1 TAB              Past Surgical History  Procedure Laterality Date  . Colposcopy    . Dilation and curettage of uterus     Social History   Social History  .  Marital Status: Single    Spouse Name: N/A  . Number of Children: N/A  . Years of Education: N/A   Occupational History  . Not on file.   Social History Main Topics  . Smoking status: Never Smoker   . Smokeless tobacco: Never Used  . Alcohol Use: No  . Drug Use: No  . Sexual Activity: Yes    Birth Control/ Protection: Condom   Other Topics Concern  . Not on file   Social History Narrative   No current facility-administered medications on file prior to encounter.   Current Outpatient Prescriptions on File Prior to Encounter  Medication Sig Dispense Refill  . promethazine (PHENERGAN) 12.5 MG tablet Take 1 tablet (12.5 mg total) by mouth every 6 (six) hours as needed for nausea. 30 tablet 0   Allergies  Allergen Reactions  . Penicillins Hives and Itching    Has patient had a PCN reaction causing immediate rash, facial/tongue/throat swelling, SOB or lightheadedness with hypotension: Yes Has patient had a PCN reaction causing severe rash involving mucus membranes or skin necrosis: No Has patient had a PCN reaction that required  hospitalization No Has patient had a PCN reaction occurring within the last 10 years: No If all of the above answers are "NO", then may proceed with Cephalosporin use.   . Latex Rash    I have reviewed the past Medical Hx, Surgical Hx, Social Hx, Allergies and Medications.   Review of Systems  Constitutional: Negative for fever and chills.  Gastrointestinal: Positive for nausea, vomiting and abdominal pain. Negative for diarrhea, constipation and blood in stool.  Genitourinary: Positive for pelvic pain. Negative for dysuria, urgency, frequency, hematuria, flank pain, vaginal bleeding, vaginal discharge and vaginal pain.       Decreased urinary output.  Musculoskeletal: Negative for back pain.  Neurological: Positive for weakness. Negative for dizziness.    OBJECTIVE Patient Vitals for the past 24 hrs:  BP Temp Temp src Pulse Resp Height Weight   08/01/15 1642 114/82 mmHg 98.6 F (37 C) Oral 84 18 5\' 4"  (1.626 m) 123 lb (55.792 kg)   Constitutional: Well-developed, well-nourished female in no acute distress.  Cardiovascular: normal rate Respiratory: normal rate and effort.  GI: Abd soft, non-tender. Pos BS x 4 MS: Extremities nontender, no edema, normal ROM Neurologic: Alert and oriented x 4.  GU: Neg CVAT.  SPECULUM EXAM: Declined   LAB RESULTS Results for orders placed or performed during the hospital encounter of 08/01/15 (from the past 24 hour(s))  Urinalysis, Routine w reflex microscopic (not at [facility])     Status: Abnormal   Collection Time: 08/01/15  4:40 PM  Result Value Ref Range   Color, Urine YELLOW YELLOW   APPearance HAZY (A) CLEAR   Specific Gravity, Urine >1.030 (H) 1.005 - 1.030   pH 5.5 5.0 - 8.0   Glucose, UA NEGATIVE NEGATIVE mg/dL   Hgb urine dipstick MODERATE (A) NEGATIVE   Bilirubin Urine NEGATIVE NEGATIVE   Ketones, ur 15 (A) NEGATIVE mg/dL   Protein, ur 100 (A) NEGATIVE mg/dL   Nitrite NEGATIVE NEGATIVE   Leukocytes, UA NEGATIVE NEGATIVE  Urine microscopic-add on     Status: Abnormal   Collection Time: 08/01/15  4:40 PM  Result Value Ref Range   Squamous Epithelial / LPF 6-30 (A) NONE SEEN   WBC, UA 0-5 0 - 5 WBC/hpf   RBC / HPF 6-30 0 - 5 RBC/hpf   Bacteria, UA MANY (A) NONE SEEN   Urine-Other MUCOUS PRESENT   Pregnancy, urine POC     Status: Abnormal   Collection Time: 08/01/15  4:54 PM  Result Value Ref Range   Preg Test, Ur POSITIVE (A) NEGATIVE  CBC     Status: None   Collection Time: 08/01/15  7:15 PM  Result Value Ref Range   WBC 8.7 4.0 - 10.5 K/uL   RBC 4.85 3.87 - 5.11 MIL/uL   Hemoglobin 12.8 12.0 - 15.0 g/dL   HCT 38.0 36.0 - 46.0 %   MCV 78.4 78.0 - 100.0 fL   MCH 26.4 26.0 - 34.0 pg   MCHC 33.7 30.0 - 36.0 g/dL   RDW 14.0 11.5 - 15.5 %   Platelets 260 150 - 400 K/uL  Comprehensive metabolic panel     Status: Abnormal   Collection Time: 08/01/15  7:15 PM  Result Value  Ref Range   Sodium 139 135 - 145 mmol/L   Potassium 3.7 3.5 - 5.1 mmol/L   Chloride 101 101 - 111 mmol/L   CO2 28 22 - 32 mmol/L   Glucose, Bld 80 65 - 99 mg/dL   BUN 5 (L)  6 - 20 mg/dL   Creatinine, Ser 0.48 0.44 - 1.00 mg/dL   Calcium 10.0 8.9 - 10.3 mg/dL   Total Protein 7.6 6.5 - 8.1 g/dL   Albumin 4.2 3.5 - 5.0 g/dL   AST 14 (L) 15 - 41 U/L   ALT 10 (L) 14 - 54 U/L   Alkaline Phosphatase 46 38 - 126 U/L   Total Bilirubin 0.5 0.3 - 1.2 mg/dL   GFR calc non Af Amer >60 >60 mL/min   GFR calc Af Amer >60 >60 mL/min   Anion gap 10 5 - 15    IMAGING US Ob Comp Less 14 Wks  08/01/2015  CLINICAL DATA:  Abdominal pain.  Pain in pregnancy.  Scroll Edit EXAM: OBSTETRIC <14 WK [place] AND TRANSVAGINAL OB US TECHNIQUE: Both transabdominal and transvaginal ultrasound examinations were performed for complete evaluation of the gestation as well as the maternal uterus, adnexal regions, and pelvic cul-de-sac. Transvaginal technique was performed to assess early pregnancy. COMPARISON:  None. FINDINGS: Intrauterine gestational sac: Single Yolk sac:  Present Embryo:  Present Cardiac Activity: Present Heart Rate: 160  bpm CRL:  6.8  mm   6 w   4 d                  [place] EDC: 03/22/2016 Maternal uterus/adnexae: Corpus luteal cyst of the RIGHT ovary. Normal LEFT ovary. Small subchorionic hemorrhage. Trace free fluid. IMPRESSION: 1. Single intrauterine gestation with embryo and normal cardiac activity. 2. Estimated gestational age by crown rump length equals 6 weeks 4 days. Electronically Signed   By: Suzy Bouchard M.D.   On: 08/01/2015 19:10   US Ob Transvaginal  08/01/2015  CLINICAL DATA:  Abdominal pain.  Pain in pregnancy.  Scroll Edit EXAM: OBSTETRIC <14 WK [place] AND TRANSVAGINAL OB US TECHNIQUE: Both transabdominal and transvaginal ultrasound examinations were performed for complete evaluation of the gestation as well as the maternal uterus, adnexal regions, and pelvic cul-de-sac. Transvaginal technique was  performed to assess early pregnancy. COMPARISON:  None. FINDINGS: Intrauterine gestational sac: Single Yolk sac:  Present Embryo:  Present Cardiac Activity: Present Heart Rate: 160  bpm CRL:  6.8  mm   6 w   4 d                  [place] EDC: 03/22/2016 Maternal uterus/adnexae: Corpus luteal cyst of the RIGHT ovary. Normal LEFT ovary. Small subchorionic hemorrhage. Trace free fluid. IMPRESSION: 1. Single intrauterine gestation with embryo and normal cardiac activity. 2. Estimated gestational age by crown rump length equals 6 weeks 4 days. Electronically Signed   By: Suzy Bouchard M.D.   On: 08/01/2015 19:10    MAU COURSE CBC, Quant, ABO/Rh, ultrasound, UA, HIV, CMP, IV fluids. Offered Phenergan, but patient does not have a ride home.   MDM -Abdominal pain in early pregnancy with normal intrauterine pregnancy and hemodynamically stable.  -Nausea and vomiting pregnancy, stable  ASSESSMENT 1. Abdominal pain complicating pregnancy, antepartum   2. Subchorionic hemorrhage in first trimester   3. Nausea and vomiting of pregnancy, antepartum     PLAN Discharge home in stable condition. First trimester precautions Rx Phenergan     Follow-up Information    Follow up with Obstetrician of your choice.   Why:  Start prenatal care      Follow up with [facility].   Why:  As needed in emergencies   Contact information:   [id number] [place]  [place] [phone]       Medication List    ASK your doctor about these medications        promethazine 12.5 MG tablet  Commonly known as:  PHENERGAN  Take 1 tablet (12.5 mg total) by mouth every 6 (six) hours as needed for nausea.  Ask about: Which instructions should I use?     promethazine 25 MG tablet  Commonly known as:  PHENERGAN  Take 1 tablet (25 mg total) by mouth every 6 (six) hours as needed for nausea or vomiting.  Ask about: Which instructions should  I use?     promethazine 25 MG suppository  Commonly known as:  PHENERGAN  Place 1 suppository (25 mg total) rectally every 6 (six) hours as needed for nausea or vomiting. May be used vaginally.  Ask about: Which instructions should I use?       [place], CNM 08/01/2015  8:07 PM  4

## 2015-08-01 NOTE — MAU Note (Signed)
PT  SAYS SHE  DOES  NOT  FEEL  NAUSEATED

## 2015-08-01 NOTE — MAU Note (Signed)
Pt reports she has been having N/V ,, weak and dizzy on and off since Sunday. Had depo shot on Nov 10. Had positive HPT on Nov 30. C/opelvic pain for several days as well.

## 2015-08-01 NOTE — Discharge Instructions (Signed)
Abdominal Pain During Pregnancy Abdominal pain is common in pregnancy. Most of the time, it does not cause harm. There are many causes of abdominal pain. Some causes are more serious than others. Some of the causes of abdominal pain in pregnancy are easily diagnosed. Occasionally, the diagnosis takes time to understand. Other times, the cause is not determined. Abdominal pain can be a sign that something is very wrong with the pregnancy, or the pain may have nothing to do with the pregnancy at all. For this reason, always tell your health care provider if you have any abdominal discomfort. HOME CARE INSTRUCTIONS  Monitor your abdominal pain for any changes. The following actions may help to alleviate any discomfort you are experiencing:  Do not have sexual intercourse or put anything in your vagina until your symptoms go away completely.  Get plenty of rest until your pain improves.  Drink clear fluids if you feel nauseous. Avoid solid food as long as you are uncomfortable or nauseous.  Only take over-the-counter or prescription medicine as directed by your health care provider.  Keep all follow-up appointments with your health care provider. SEEK IMMEDIATE MEDICAL CARE IF:  You are bleeding, leaking fluid, or passing tissue from the vagina.  You have increasing pain or cramping.  You have persistent vomiting.  You have painful or bloody urination.  You have a fever.  You notice a decrease in your baby's movements.  You have extreme weakness or feel faint.  You have shortness of breath, with or without abdominal pain.  You develop a severe headache with abdominal pain.  You have abnormal vaginal discharge with abdominal pain.  You have persistent diarrhea.  You have abdominal pain that continues even after rest, or gets worse. MAKE SURE YOU:   Understand these instructions.  Will watch your condition.  Will get help right away if you are not doing well or get worse.     This information is not intended to replace advice given to you by your health care provider. Make sure you discuss any questions you have with your health care provider.   Document Released: 08/10/2005 Document Revised: 05/31/2013 Document Reviewed: 03/09/2013 Elsevier Interactive Patient Education 2016 Elsevier Inc.  Morning Sickness Morning sickness is when you feel sick to your stomach (nauseous) during pregnancy. This nauseous feeling may or may not come with vomiting. It often occurs in the morning but can be a problem any time of day. Morning sickness is most common during the first trimester, but it may continue throughout pregnancy. While morning sickness is unpleasant, it is usually harmless unless you develop severe and continual vomiting (hyperemesis gravidarum). This condition requires more intense treatment.  CAUSES  The cause of morning sickness is not completely known but seems to be related to normal hormonal changes that occur in pregnancy. RISK FACTORS You are at greater risk if you:  Experienced nausea or vomiting before your pregnancy.  Had morning sickness during a previous pregnancy.  Are pregnant with more than one baby, such as twins. TREATMENT  Do not use any medicines (prescription, over-the-counter, or herbal) for morning sickness without first talking to your health care provider. Your health care provider may prescribe or recommend:  Vitamin B6 supplements.  Anti-nausea medicines.  The herbal medicine ginger. HOME CARE INSTRUCTIONS   Only take over-the-counter or prescription medicines as directed by your health care provider.  Taking multivitamins before getting pregnant can prevent or decrease the severity of morning sickness in most women.  Eat a  piece of dry toast or unsalted crackers before getting out of bed in the morning.  Eat five or six small meals a day.  Eat dry and bland foods (rice, baked potato). Foods high in carbohydrates are often  helpful.  Do not drink liquids with your meals. Drink liquids between meals.  Avoid greasy, fatty, and spicy foods.  Get someone to cook for you if the smell of any food causes nausea and vomiting.  If you feel nauseous after taking prenatal vitamins, take the vitamins at night or with a snack.  Snack on protein foods (nuts, yogurt, cheese) between meals if you are hungry.  Eat unsweetened gelatins for desserts.  Wearing an acupressure wristband (worn for sea sickness) may be helpful.  Acupuncture may be helpful.  Do not smoke.  Get a humidifier to keep the air in your house free of odors.  Get plenty of fresh air. SEEK MEDICAL CARE IF:   Your home remedies are not working, and you need medicine.  You feel dizzy or lightheaded.  You are losing weight. SEEK IMMEDIATE MEDICAL CARE IF:   You have persistent and uncontrolled nausea and vomiting.  You pass out (faint). MAKE SURE YOU:  Understand these instructions.  Will watch your condition.  Will get help right away if you are not doing well or get worse.   This information is not intended to replace advice given to you by your health care provider. Make sure you discuss any questions you have with your health care provider.   Document Released: 10/01/2006 Document Revised: 08/15/2013 Document Reviewed: 01/25/2013 Elsevier Interactive Patient Education [employer].

## 2015-08-02 LAB — HIV ANTIBODY (ROUTINE TESTING W REFLEX): HIV SCREEN 4TH GENERATION: NONREACTIVE

## 2015-08-03 LAB — CULTURE, OB URINE: SPECIAL REQUESTS: NORMAL

## 2015-08-25 NOTE — L&D Delivery Note (Signed)
Delivery Note At 2:40 PM a viable and healthy female was delivered via Vaginal, Spontaneous Delivery (Presentation: LOA;  ).  APGAR: 7, 7; weight pending. Placenta status: spontaneous, intact.  Cord: 3 vessels with the following complications: none.  Cord pH: 7.36  Anesthesia: epidural  Episiotomy: None Lacerations:  none Suture Repair: n/a Est. Blood Loss (mL): 100 ml   Mom to postpartum.  Baby to Nursery.  Kimberly Bennett is a 35 y.o. female [phone] with IUP at [redacted]w[redacted]d admitted for SOL.  She progressed without augmentation to complete and pushed less than 10 min to deliver.  Cord clamping delayed by several minutes then clamped by medical student and cut by patient.  Placenta intact and spontaneous, bleeding minimal.  Intact perineum. Mom and baby stable prior to transfer to postpartum. She plans on breastfeeding and bottle. She requests BTL for birth control.   [name], Omega Durante 03/19/2016, 3:03 PM

## 2015-09-16 ENCOUNTER — Other Ambulatory Visit (HOSPITAL_COMMUNITY): Payer: Self-pay | Admitting: Nurse Practitioner

## 2015-09-16 DIAGNOSIS — Z369 Encounter for antenatal screening, unspecified: Secondary | ICD-10-CM

## 2015-09-16 LAB — OB RESULTS CONSOLE PLATELET COUNT: PLATELETS: 307 10*3/uL

## 2015-09-16 LAB — OB RESULTS CONSOLE ABO/RH: RH TYPE: POSITIVE

## 2015-09-16 LAB — OB RESULTS CONSOLE VARICELLA ZOSTER ANTIBODY, IGG: Varicella: NON-IMMUNE/NOT IMMUNE

## 2015-09-16 LAB — OB RESULTS CONSOLE GC/CHLAMYDIA
CHLAMYDIA, DNA PROBE: NEGATIVE
GC PROBE AMP, GENITAL: NEGATIVE

## 2015-09-16 LAB — SICKLE CELL SCREEN: Sickle Cell Screen: NORMAL

## 2015-09-16 LAB — OB RESULTS CONSOLE HGB/HCT, BLOOD
HEMATOCRIT: 36 %
HEMOGLOBIN: 11.7 g/dL

## 2015-09-16 LAB — OB RESULTS CONSOLE HEPATITIS B SURFACE ANTIGEN: Hepatitis B Surface Ag: NEGATIVE

## 2015-09-16 LAB — OB RESULTS CONSOLE RUBELLA ANTIBODY, IGM: Rubella: IMMUNE

## 2015-09-16 LAB — OB RESULTS CONSOLE RPR: RPR: NONREACTIVE

## 2015-09-16 LAB — OB RESULTS CONSOLE HIV ANTIBODY (ROUTINE TESTING): HIV: NONREACTIVE

## 2015-09-16 LAB — HEMOGLOBINOPATHY EVALUATION: Hemoglobin, Elp: NORMAL

## 2015-09-16 LAB — OB RESULTS CONSOLE ANTIBODY SCREEN: ANTIBODY SCREEN: NEGATIVE

## 2015-09-23 ENCOUNTER — Ambulatory Visit (HOSPITAL_COMMUNITY)
Admission: RE | Admit: 2015-09-23 | Discharge: 2015-09-23 | Disposition: A | Discharge status: Home / Self Care | Payer: Medicaid Other | Source: Ambulatory Visit | Attending: Nurse Practitioner | Admitting: Nurse Practitioner

## 2015-09-23 ENCOUNTER — Encounter (HOSPITAL_COMMUNITY): Payer: Self-pay

## 2015-09-23 ENCOUNTER — Other Ambulatory Visit (HOSPITAL_COMMUNITY): Payer: Self-pay | Admitting: Nurse Practitioner

## 2015-09-23 DIAGNOSIS — O10011 Pre-existing essential hypertension complicating pregnancy, first trimester: Secondary | ICD-10-CM | POA: Insufficient documentation

## 2015-09-23 DIAGNOSIS — O09211 Supervision of pregnancy with history of pre-term labor, first trimester: Secondary | ICD-10-CM | POA: Diagnosis not present

## 2015-09-23 DIAGNOSIS — Z3A13 13 weeks gestation of pregnancy: Secondary | ICD-10-CM | POA: Insufficient documentation

## 2015-09-23 DIAGNOSIS — O09521 Supervision of elderly multigravida, first trimester: Secondary | ICD-10-CM | POA: Insufficient documentation

## 2015-09-23 DIAGNOSIS — Z369 Encounter for antenatal screening, unspecified: Secondary | ICD-10-CM

## 2015-09-24 ENCOUNTER — Encounter (HOSPITAL_COMMUNITY): Payer: Medicaid Other

## 2015-09-24 ENCOUNTER — Other Ambulatory Visit (HOSPITAL_COMMUNITY): Payer: Self-pay | Admitting: *Deleted

## 2015-09-24 DIAGNOSIS — O09529 Supervision of elderly multigravida, unspecified trimester: Secondary | ICD-10-CM

## 2015-10-01 ENCOUNTER — Other Ambulatory Visit (HOSPITAL_COMMUNITY): Payer: Self-pay

## 2015-10-01 ENCOUNTER — Ambulatory Visit (HOSPITAL_COMMUNITY)
Admission: RE | Admit: 2015-10-01 | Discharge: 2015-10-01 | Disposition: A | Discharge status: Home / Self Care | Payer: Medicaid Other | Source: Ambulatory Visit | Attending: Obstetrics and Gynecology | Admitting: Obstetrics and Gynecology

## 2015-10-01 ENCOUNTER — Ambulatory Visit (HOSPITAL_COMMUNITY): Payer: MEDICAID

## 2015-10-01 ENCOUNTER — Other Ambulatory Visit (HOSPITAL_COMMUNITY): Payer: Medicaid Other

## 2015-10-01 DIAGNOSIS — O09529 Supervision of elderly multigravida, unspecified trimester: Secondary | ICD-10-CM | POA: Insufficient documentation

## 2015-10-01 DIAGNOSIS — O09522 Supervision of elderly multigravida, second trimester: Secondary | ICD-10-CM

## 2015-10-01 NOTE — Progress Notes (Signed)
Genetic Counseling  High-Risk Gestation Note  Appointment Date:  10/01/2015 Referred By: Mora Bellman, MD Date of Birth:  [birth year]   Pregnancy History: [id number] Estimated Date of Delivery: 03/22/16 Estimated Gestational Age: [redacted]w[redacted]d Attending: Benjaman Lobe, MD  Kimberly Bennett was seen for genetic counseling because of a maternal age of 35 y.o..  She will be 35 years old at delivery.    In summary:  Discussed increased risk for fetal aneuploidy with advanced maternal age  Reviewed results of patient's NIPS (Panorama), which were within normal limits  NT ultrasound attempted last week, but crown rump length too large for study  Detailed ultrasound scheduled for 10/22/15  Discussed limitations of screening and option of diagnostic testing  Declined amniocentesis  Reviewed family history concerns  Patient reported cousin with sickle cell trait; Patient previously had hemoglobin electrophoresis within normal range  She was counseled regarding maternal age and the association with risk for chromosome conditions due to nondisjunction with aging of the ova.   We reviewed chromosomes, nondisjunction, and the associated 1 in 141 risk for fetal aneuploidy at [redacted]w[redacted]d gestation related to a maternal age of 35 years old at delivery.  She was counseled that the risk for aneuploidy decreases as gestational age increases, accounting for those pregnancies which spontaneously abort.  We specifically discussed Down syndrome (trisomy 60), trisomies 29 and 42, and sex chromosome aneuploidies (47,XXX and 47,XXY) including the common features and prognoses of each.   Kimberly Bennett was seen on 09/23/15 for first trimester screening, but crown rump length was too far in gestation for first trimester screening at that time. Ultrasound results reported separately. She elected to pursue noninvasive prenatal screening (NIPS)/cell free DNA testing at that time.   We reviewed the results of Kimberly Bennett's  non-invasive prenatal screening (NIPS). Panorama was performed through [facility] laboratory and was within normal range for all conditions screened. We discussed that NIPS analyzes placental cell free DNA in maternal circulation to evaluate for the presence of extra chromosome conditions.  Thus, it is able to provide risk assessment for specific chromosome conditions, but is not diagnostic.  The results showed a less than 1 in 10,000 risk for trisomies 21, 18 and 13, and monosomy X (Turner syndrome).  In addition, the risk for triploidy/vanishing twin and sex chromosome trisomies (47,XXX and 47,XXY) was also low risk.  We reviewed that this testing identifies > 99% of pregnancies with trisomy 77, trisomy 62, sex chromosome trisomies (47,XXX and 47,XXY), and triploidy. The detection rate for trisomy 18 is 96%.  The detection rate for monosomy X is ~92%.  The false positive rate is <0.1% for all conditions. Testing was also consistent with female fetal sex.  The patient did to know fetal sex.   We reviewed the benefits and limitations of targeted ultrasound as a screening tool for fetal aneuploidy. Detailed ultrasound is scheduled for 10/22/15.    We also discussed the availability of diagnostic testing by way of amniocentesis.  We reviewed the risks, benefits and limitations of amniocentesis including the approximate 1 in 300-500 risk for pregnancy complications following amniocentesis.  After reviewing the above results and the available options, Kimberly Bennett expressed that she is not interested in pursuing diagnostic testing at this time or in the future, given the associated risk of complications.  She understands that ultrasound and NIPS cannot rule out all birth defects or genetic syndromes.    Kimberly Bennett was provided with written information regarding sickle cell anemia (SCA) including the carrier  frequency and incidence in the African-American population, the availability of carrier testing and prenatal  diagnosis if indicated.  In addition, we discussed that hemoglobinopathies are routinely screened for as part of the [place] newborn screening panel. She reported a maternal half-first cousin with sickle cell trait and reported that two of that cousin's children have sickle cell anemia. Kimberly Bennett, Kimberly Bennett indicate that she had hemoglobin electrophoresis performed in 2001, which was within normal range.     Both family histories were reviewed and found to be otherwise noncontributory for birth defects, intellectual disability, and known genetic conditions. She reported limited knowledge of the father of the pregnancy's extended family history. Without further information regarding the provided family history, an accurate genetic risk cannot be calculated. Further genetic counseling is warranted if more information is obtained.  Kimberly Bennett denied exposure to environmental toxins or chemical agents. She denied the use of alcohol, tobacco or street drugs. She denied significant viral illnesses during the course of her pregnancy. Her medical and surgical histories were contributory for history of hypertension in her last pregnancy. She reported that she is being monitored by her OB given this pregnancy history.    I counseled Kimberly Bennett regarding the above risks and available options.  The approximate face-to-face time with the genetic counselor was 30 minutes.  Chipper Oman, MS,  Certified Genetic Counselor 10/01/2015

## 2015-10-14 LAB — OB RESULTS CONSOLE GC/CHLAMYDIA
Chlamydia: NEGATIVE
GC PROBE AMP, GENITAL: NEGATIVE

## 2015-10-22 ENCOUNTER — Other Ambulatory Visit (HOSPITAL_COMMUNITY): Payer: Self-pay | Admitting: Obstetrics and Gynecology

## 2015-10-22 ENCOUNTER — Ambulatory Visit (HOSPITAL_COMMUNITY)
Admission: RE | Admit: 2015-10-22 | Discharge: 2015-10-22 | Disposition: A | Discharge status: Home / Self Care | Payer: Medicaid Other | Source: Ambulatory Visit | Attending: Nurse Practitioner | Admitting: Nurse Practitioner

## 2015-10-22 ENCOUNTER — Encounter (HOSPITAL_COMMUNITY): Payer: Self-pay

## 2015-10-22 ENCOUNTER — Ambulatory Visit (HOSPITAL_COMMUNITY): Payer: MEDICAID

## 2015-10-22 DIAGNOSIS — Z3A17 17 weeks gestation of pregnancy: Secondary | ICD-10-CM

## 2015-10-22 DIAGNOSIS — O09212 Supervision of pregnancy with history of pre-term labor, second trimester: Secondary | ICD-10-CM | POA: Insufficient documentation

## 2015-10-22 DIAGNOSIS — O09522 Supervision of elderly multigravida, second trimester: Secondary | ICD-10-CM

## 2015-10-22 DIAGNOSIS — O09892 Supervision of other high risk pregnancies, second trimester: Secondary | ICD-10-CM

## 2015-10-22 DIAGNOSIS — Z36 Encounter for antenatal screening of mother: Secondary | ICD-10-CM | POA: Diagnosis not present

## 2015-10-22 DIAGNOSIS — Z1389 Encounter for screening for other disorder: Secondary | ICD-10-CM

## 2015-10-22 DIAGNOSIS — O10012 Pre-existing essential hypertension complicating pregnancy, second trimester: Secondary | ICD-10-CM | POA: Diagnosis present

## 2015-10-22 DIAGNOSIS — O09292 Supervision of pregnancy with other poor reproductive or obstetric history, second trimester: Secondary | ICD-10-CM | POA: Diagnosis not present

## 2015-10-22 DIAGNOSIS — O10019 Pre-existing essential hypertension complicating pregnancy, unspecified trimester: Secondary | ICD-10-CM

## 2015-10-22 DIAGNOSIS — O09529 Supervision of elderly multigravida, unspecified trimester: Secondary | ICD-10-CM

## 2015-10-24 ENCOUNTER — Encounter: Payer: Medicaid Other | Admitting: Obstetrics & Gynecology

## 2015-10-25 ENCOUNTER — Encounter: Payer: Self-pay | Admitting: *Deleted

## 2015-10-25 DIAGNOSIS — O09529 Supervision of elderly multigravida, unspecified trimester: Secondary | ICD-10-CM

## 2015-10-25 DIAGNOSIS — Z283 Underimmunization status: Secondary | ICD-10-CM

## 2015-10-25 DIAGNOSIS — Z2839 Other underimmunization status: Secondary | ICD-10-CM | POA: Insufficient documentation

## 2015-10-25 DIAGNOSIS — O09899 Supervision of other high risk pregnancies, unspecified trimester: Secondary | ICD-10-CM

## 2015-10-25 DIAGNOSIS — IMO0001 Reserved for inherently not codable concepts without codable children: Secondary | ICD-10-CM | POA: Insufficient documentation

## 2015-10-25 DIAGNOSIS — O0990 Supervision of high risk pregnancy, unspecified, unspecified trimester: Secondary | ICD-10-CM | POA: Insufficient documentation

## 2015-10-28 ENCOUNTER — Encounter: Payer: Self-pay | Admitting: *Deleted

## 2015-11-05 ENCOUNTER — Encounter (HOSPITAL_COMMUNITY): Payer: Self-pay

## 2015-11-05 ENCOUNTER — Ambulatory Visit (HOSPITAL_COMMUNITY)
Admission: RE | Admit: 2015-11-05 | Discharge: 2015-11-05 | Disposition: A | Discharge status: Home / Self Care | Payer: Medicaid Other | Source: Ambulatory Visit | Attending: Nurse Practitioner | Admitting: Nurse Practitioner

## 2015-11-05 VITALS — BP 131/79 | HR 84 | Wt 142.0 lb

## 2015-11-05 DIAGNOSIS — O09522 Supervision of elderly multigravida, second trimester: Secondary | ICD-10-CM | POA: Insufficient documentation

## 2015-11-05 DIAGNOSIS — O09212 Supervision of pregnancy with history of pre-term labor, second trimester: Secondary | ICD-10-CM | POA: Diagnosis present

## 2015-11-05 DIAGNOSIS — Z3A19 19 weeks gestation of pregnancy: Secondary | ICD-10-CM | POA: Diagnosis not present

## 2015-11-05 DIAGNOSIS — O09529 Supervision of elderly multigravida, unspecified trimester: Secondary | ICD-10-CM

## 2015-11-05 DIAGNOSIS — IMO0001 Reserved for inherently not codable concepts without codable children: Secondary | ICD-10-CM

## 2015-11-05 DIAGNOSIS — O09292 Supervision of pregnancy with other poor reproductive or obstetric history, second trimester: Secondary | ICD-10-CM | POA: Diagnosis not present

## 2015-11-05 DIAGNOSIS — O0990 Supervision of high risk pregnancy, unspecified, unspecified trimester: Secondary | ICD-10-CM

## 2015-11-11 ENCOUNTER — Encounter: Payer: Medicaid Other | Admitting: Family Medicine

## 2015-11-15 ENCOUNTER — Encounter: Payer: Self-pay | Admitting: *Deleted

## 2015-11-19 ENCOUNTER — Ambulatory Visit (HOSPITAL_COMMUNITY)
Admission: RE | Admit: 2015-11-19 | Discharge: 2015-11-19 | Disposition: A | Discharge status: Home / Self Care | Payer: Medicaid Other | Source: Ambulatory Visit | Attending: Family Medicine | Admitting: Family Medicine

## 2015-11-19 ENCOUNTER — Encounter (HOSPITAL_COMMUNITY): Payer: Self-pay

## 2015-11-19 VITALS — BP 115/75 | HR 116 | Wt 146.2 lb

## 2015-11-19 DIAGNOSIS — O09892 Supervision of other high risk pregnancies, second trimester: Secondary | ICD-10-CM

## 2015-11-19 DIAGNOSIS — O10012 Pre-existing essential hypertension complicating pregnancy, second trimester: Secondary | ICD-10-CM | POA: Insufficient documentation

## 2015-11-19 DIAGNOSIS — O09212 Supervision of pregnancy with history of pre-term labor, second trimester: Secondary | ICD-10-CM | POA: Insufficient documentation

## 2015-11-19 DIAGNOSIS — O09522 Supervision of elderly multigravida, second trimester: Secondary | ICD-10-CM

## 2015-11-19 DIAGNOSIS — Z3A21 21 weeks gestation of pregnancy: Secondary | ICD-10-CM | POA: Insufficient documentation

## 2015-11-19 DIAGNOSIS — IMO0001 Reserved for inherently not codable concepts without codable children: Secondary | ICD-10-CM

## 2015-11-19 DIAGNOSIS — O09529 Supervision of elderly multigravida, unspecified trimester: Secondary | ICD-10-CM

## 2015-11-19 DIAGNOSIS — O09292 Supervision of pregnancy with other poor reproductive or obstetric history, second trimester: Secondary | ICD-10-CM | POA: Diagnosis not present

## 2015-11-19 DIAGNOSIS — O0990 Supervision of high risk pregnancy, unspecified, unspecified trimester: Secondary | ICD-10-CM

## 2015-12-03 ENCOUNTER — Ambulatory Visit (HOSPITAL_COMMUNITY)
Admission: RE | Admit: 2015-12-03 | Discharge: 2015-12-03 | Disposition: A | Discharge status: Home / Self Care | Payer: Medicaid Other | Source: Ambulatory Visit | Attending: Obstetrics & Gynecology | Admitting: Obstetrics & Gynecology

## 2015-12-03 ENCOUNTER — Encounter (HOSPITAL_COMMUNITY): Payer: Self-pay

## 2015-12-03 VITALS — BP 117/58 | HR 96 | Wt 151.2 lb

## 2015-12-03 DIAGNOSIS — O09292 Supervision of pregnancy with other poor reproductive or obstetric history, second trimester: Secondary | ICD-10-CM | POA: Diagnosis not present

## 2015-12-03 DIAGNOSIS — O10012 Pre-existing essential hypertension complicating pregnancy, second trimester: Secondary | ICD-10-CM | POA: Diagnosis not present

## 2015-12-03 DIAGNOSIS — O09212 Supervision of pregnancy with history of pre-term labor, second trimester: Secondary | ICD-10-CM | POA: Diagnosis present

## 2015-12-03 DIAGNOSIS — O09522 Supervision of elderly multigravida, second trimester: Secondary | ICD-10-CM | POA: Diagnosis not present

## 2015-12-03 DIAGNOSIS — O0990 Supervision of high risk pregnancy, unspecified, unspecified trimester: Secondary | ICD-10-CM

## 2015-12-03 DIAGNOSIS — Z3A23 23 weeks gestation of pregnancy: Secondary | ICD-10-CM | POA: Diagnosis not present

## 2015-12-03 DIAGNOSIS — IMO0001 Reserved for inherently not codable concepts without codable children: Secondary | ICD-10-CM

## 2015-12-10 ENCOUNTER — Ambulatory Visit (INDEPENDENT_AMBULATORY_CARE_PROVIDER_SITE_OTHER): Payer: Medicaid Other | Admitting: Student

## 2015-12-10 VITALS — BP 126/66 | HR 70 | Wt 152.0 lb

## 2015-12-10 DIAGNOSIS — O09522 Supervision of elderly multigravida, second trimester: Secondary | ICD-10-CM

## 2015-12-10 DIAGNOSIS — O132 Gestational [pregnancy-induced] hypertension without significant proteinuria, second trimester: Secondary | ICD-10-CM

## 2015-12-10 DIAGNOSIS — O0992 Supervision of high risk pregnancy, unspecified, second trimester: Secondary | ICD-10-CM

## 2015-12-10 DIAGNOSIS — IMO0001 Reserved for inherently not codable concepts without codable children: Secondary | ICD-10-CM

## 2015-12-10 LAB — POCT URINALYSIS DIP (DEVICE)
BILIRUBIN URINE: NEGATIVE
Glucose, UA: NEGATIVE mg/dL
LEUKOCYTES UA: NEGATIVE
Nitrite: NEGATIVE
Protein, ur: NEGATIVE mg/dL
Specific Gravity, Urine: >=1.03 (ref 1.005–1.030)
Urobilinogen, UA: 2 mg/dL — ABNORMAL HIGH (ref 0.0–1.0)
pH: 5.5 (ref 5.0–8.0)

## 2015-12-10 NOTE — Progress Notes (Signed)
Subjective:    Kimberly Bennett is a [id number] [redacted]w[redacted]d being seen today for her first obstetrical visit.  Her obstetrical history is significant for pregnancy induced hypertension and preterm delivery (G1 @ 34 wks). Patient does intend to breast & bottle feed. Pregnancy history fully reviewed. Patient is a transfer from the health department.   Patient reports no complaints.  Filed Vitals:   12/10/15 1341  BP: 126/66  Pulse: 70  Weight: 152 lb (68.947 kg)    HISTORY: OB History  Gravida Para Term Preterm AB SAB TAB Ectopic Multiple Living  8 4 3 1 3 2 1  0 0 4    # Outcome Date GA Lbr Len/2nd Weight Sex Delivery Anes PTL Lv  8 Current           7 SAB 04/2015     SAB     6 TAB 2015          5 Term 10/27/11 [redacted]w[redacted]d 05:56 / 00:09 7 lb 14.6 oz (3.589 kg) [address] EPI  Y  4 Term 08/22/07 [redacted]w[redacted]d  5 lb 15 oz (2.693 kg) F Vag-Spont  N Y  3 Term 06/14/01 [redacted]w[redacted]d  5 lb 10 oz (2.551 kg) F Vag-Spont None N Y     Complications: Avulsion of umbilical cord  2 Preterm 06/23/00 [redacted]w[redacted]d  4 lb 12 oz (2.155 kg) F Vag-Spont  Y Y     Complications: Prolonged premature rupture of membranes  1 SAB 1998             Past Medical History  Diagnosis Date  . Urinary tract infection   . Chlamydia   . Trichomonas   . Abnormal Pap smear of cervix     colposcopy 2012, Jan  . Pregnancy induced hypertension    Past Surgical History  Procedure Laterality Date  . Colposcopy    . Dilation and curettage of uterus     Family History  Problem Relation Age of Onset  . Hypertension Mother   . Diabetes Maternal Aunt   . Hypertension Maternal Grandmother   . Diabetes Maternal Grandmother   . Asthma Daughter   . Asthma Son      Exam    Uterus:  Fundal Height: 26 cm   Skin: normal coloration and turgor, no rashes    Neurologic: oriented, normal, normal mood   Extremities: normal strength, tone, and muscle mass, no deformities   HEENT PERRLA   Mouth/Teeth mucous membranes moist, pharynx normal without  lesions and dental hygiene good   Neck supple and no masses   Cardiovascular: regular rate and rhythm   Respiratory:  appears well, vitals normal, no respiratory distress, acyanotic, normal RR, neck free of mass or lymphadenopathy   Abdomen: soft, non-tender; bowel sounds normal; no masses,  no organomegaly      Assessment:    Pregnancy: [id number] Patient Active Problem List   Diagnosis Date Noted  . Supervision of high-risk pregnancy 10/25/2015  . PIH (pregnancy induced hypertension), previous postpartum condition 10/25/2015  . Maternal varicella, non-immune 10/25/2015  . Advanced maternal age in multigravida 10/01/2015  . Asthma 10/27/2011        Plan:  1. Supervision of high-risk pregnancy, second trimester  - Culture, OB Urine - Prescript Monitor Profile(19)  2. PIH (pregnancy induced hypertension), previous postpartum condition  -taking Asprin 81 mg daily    Initial labs drawn. Prenatal vitamins. Problem list reviewed and updated. Follow up in 4 weeks.    Jorje Guild 12/10/2015

## 2015-12-10 NOTE — Patient Instructions (Signed)
Fetal Movement Counts Patient Name: __________________________________________________ Patient Due Date: ____________________ Performing a fetal movement count is highly recommended in high-risk pregnancies, but it is good for every pregnant woman to do. Your health care provider may ask you to start counting fetal movements at 28 weeks of the pregnancy. Fetal movements often increase:  After eating a full meal.  After physical activity.  After eating or drinking something sweet or cold.  At rest. Pay attention to when you feel the baby is most active. This will help you notice a pattern of your baby's sleep and wake cycles and what factors contribute to an increase in fetal movement. It is important to perform a fetal movement count at the same time each day when your baby is normally most active.  HOW TO COUNT FETAL MOVEMENTS 1. Find a quiet and comfortable area to sit or lie down on your left side. Lying on your left side provides the best blood and oxygen circulation to your baby. 2. Write down the day and time on a sheet of paper or in a journal. 3. Start counting kicks, flutters, swishes, rolls, or jabs in a 2-hour period. You should feel at least 10 movements within 2 hours. 4. If you do not feel 10 movements in 2 hours, wait 2-3 hours and count again. Look for a change in the pattern or not enough counts in 2 hours. SEEK MEDICAL CARE IF:  You feel less than 10 counts in 2 hours, tried twice.  There is no movement in over an hour.  The pattern is changing or taking longer each day to reach 10 counts in 2 hours.  You feel the baby is not moving as he or she usually does. Date: ____________ Movements: ____________ Start time: ____________ Kimberly Bennett time: ____________  Date: ____________ Movements: ____________ Start time: ____________ Kimberly Bennett time: ____________ Date: ____________ Movements: ____________ Start time: ____________ Kimberly Bennett time: ____________ Date: ____________ Movements:  ____________ Start time: ____________ Kimberly Bennett time: ____________ Date: ____________ Movements: ____________ Start time: ____________ Kimberly Bennett time: ____________ Date: ____________ Movements: ____________ Start time: ____________ Kimberly Bennett time: ____________ Date: ____________ Movements: ____________ Start time: ____________ Kimberly Bennett time: ____________ Date: ____________ Movements: ____________ Start time: ____________ Kimberly Bennett time: ____________  Date: ____________ Movements: ____________ Start time: ____________ Kimberly Bennett time: ____________ Date: ____________ Movements: ____________ Start time: ____________ Kimberly Bennett time: ____________ Date: ____________ Movements: ____________ Start time: ____________ Kimberly Bennett time: ____________ Date: ____________ Movements: ____________ Start time: ____________ Kimberly Bennett time: ____________ Date: ____________ Movements: ____________ Start time: ____________ Kimberly Bennett time: ____________ Date: ____________ Movements: ____________ Start time: ____________ Kimberly Bennett time: ____________ Date: ____________ Movements: ____________ Start time: ____________ Kimberly Bennett time: ____________  Date: ____________ Movements: ____________ Start time: ____________ Kimberly Bennett time: ____________ Date: ____________ Movements: ____________ Start time: ____________ Kimberly Bennett time: ____________ Date: ____________ Movements: ____________ Start time: ____________ Kimberly Bennett time: ____________ Date: ____________ Movements: ____________ Start time: ____________ Kimberly Bennett time: ____________ Date: ____________ Movements: ____________ Start time: ____________ Kimberly Bennett time: ____________ Date: ____________ Movements: ____________ Start time: ____________ Kimberly Bennett time: ____________ Date: ____________ Movements: ____________ Start time: ____________ Kimberly Bennett time: ____________  Date: ____________ Movements: ____________ Start time: ____________ Kimberly Bennett time: ____________ Date: ____________ Movements: ____________ Start time: ____________ Kimberly Bennett  time: ____________ Date: ____________ Movements: ____________ Start time: ____________ Kimberly Bennett time: ____________ Date: ____________ Movements: ____________ Start time: ____________ Kimberly Bennett time: ____________ Date: ____________ Movements: ____________ Start time: ____________ Kimberly Bennett time: ____________ Date: ____________ Movements: ____________ Start time: ____________ Kimberly Bennett time: ____________ Date: ____________ Movements: ____________ Start time: ____________ Kimberly Bennett time: ____________  Date: ____________ Movements: ____________ Start time: ____________ Kimberly Bennett  time: ____________ Date: ____________ Movements: ____________ Start time: ____________ Kimberly Bennett time: ____________ Date: ____________ Movements: ____________ Start time: ____________ Kimberly Bennett time: ____________ Date: ____________ Movements: ____________ Start time: ____________ Kimberly Bennett time: ____________ Date: ____________ Movements: ____________ Start time: ____________ Kimberly Bennett time: ____________ Date: ____________ Movements: ____________ Start time: ____________ Kimberly Bennett time: ____________ Date: ____________ Movements: ____________ Start time: ____________ Kimberly Bennett time: ____________  Date: ____________ Movements: ____________ Start time: ____________ Kimberly Bennett time: ____________ Date: ____________ Movements: ____________ Start time: ____________ Kimberly Bennett time: ____________ Date: ____________ Movements: ____________ Start time: ____________ Kimberly Bennett time: ____________ Date: ____________ Movements: ____________ Start time: ____________ Kimberly Bennett time: ____________ Date: ____________ Movements: ____________ Start time: ____________ Kimberly Bennett time: ____________ Date: ____________ Movements: ____________ Start time: ____________ Kimberly Bennett time: ____________ Date: ____________ Movements: ____________ Start time: ____________ Kimberly Bennett time: ____________  Date: ____________ Movements: ____________ Start time: ____________ Kimberly Bennett time: ____________ Date: ____________  Movements: ____________ Start time: ____________ Kimberly Bennett time: ____________ Date: ____________ Movements: ____________ Start time: ____________ Kimberly Bennett time: ____________ Date: ____________ Movements: ____________ Start time: ____________ Kimberly Bennett time: ____________ Date: ____________ Movements: ____________ Start time: ____________ Kimberly Bennett time: ____________ Date: ____________ Movements: ____________ Start time: ____________ Kimberly Bennett time: ____________ Date: ____________ Movements: ____________ Start time: ____________ Kimberly Bennett time: ____________  Date: ____________ Movements: ____________ Start time: ____________ Kimberly Bennett time: ____________ Date: ____________ Movements: ____________ Start time: ____________ Kimberly Bennett time: ____________ Date: ____________ Movements: ____________ Start time: ____________ Kimberly Bennett time: ____________ Date: ____________ Movements: ____________ Start time: ____________ Kimberly Bennett time: ____________ Date: ____________ Movements: ____________ Start time: ____________ Kimberly Bennett time: ____________ Date: ____________ Movements: ____________ Start time: ____________ Kimberly Bennett time: ____________   This information is not intended to replace advice given to you by your health care provider. Make sure you discuss any questions you have with your health care provider.   Document Released: 09/09/2006 Document Revised: 08/31/2014 Document Reviewed: 06/06/2012 Elsevier Interactive Patient Education 2016 [place] of Pregnancy The second trimester is from week 13 through week 28, months 4 through 6. The second trimester is often a time when you feel your best. Your body has also adjusted to being pregnant, and you begin to feel better physically. Usually, morning sickness has lessened or quit completely, you may have more energy, and you may have an increase in appetite. The second trimester is also a time when the fetus is growing rapidly. At the end of the sixth month, the fetus is  about 9 inches long and weighs about 1 pounds. You will likely begin to feel the baby move (quickening) between 18 and 20 weeks of the pregnancy. BODY CHANGES Your body goes through many changes during pregnancy. The changes vary from woman to woman.   Your weight will continue to increase. You will notice your lower abdomen bulging out.  You may begin to get stretch marks on your hips, abdomen, and breasts.  You may develop headaches that can be relieved by medicines approved by your health care provider.  You may urinate more often because the fetus is pressing on your bladder.  You may develop or continue to have heartburn as a result of your pregnancy.  You may develop constipation because certain hormones are causing the muscles that push waste through your intestines to slow down.  You may develop hemorrhoids or swollen, bulging veins (varicose veins).  You may have back pain because of the weight gain and pregnancy hormones relaxing your joints between the bones in your pelvis and as a result of a shift in weight and the muscles that support your balance.  Your breasts will  continue to grow and be tender.  Your gums may bleed and may be sensitive to brushing and flossing.  Dark spots or blotches (chloasma, mask of pregnancy) may develop on your face. This will likely fade after the baby is born.  A dark line from your belly button to the pubic area (linea nigra) may appear. This will likely fade after the baby is born.  You may have changes in your hair. These can include thickening of your hair, rapid growth, and changes in texture. Some women also have hair loss during or after pregnancy, or hair that feels dry or thin. Your hair will most likely return to normal after your baby is born. WHAT TO EXPECT AT YOUR PRENATAL VISITS During a routine prenatal visit: 5. You will be weighed to make sure you and the fetus are growing normally. 6. Your blood pressure will be  taken. 7. Your abdomen will be measured to track your baby's growth. 8. The fetal heartbeat will be listened to. 9. Any test results from the previous visit will be discussed. Your health care provider may ask you:  How you are feeling.  If you are feeling the baby move.  If you have had any abnormal symptoms, such as leaking fluid, bleeding, severe headaches, or abdominal cramping.  If you are using any tobacco products, including cigarettes, chewing tobacco, and electronic cigarettes.  If you have any questions. Other tests that may be performed during your second trimester include:  Blood tests that check for:  Low iron levels (anemia).  Gestational diabetes (between 24 and 28 weeks).  Rh antibodies.  Urine tests to check for infections, diabetes, or protein in the urine.  An ultrasound to confirm the proper growth and development of the baby.  An amniocentesis to check for possible genetic problems.  Fetal screens for spina bifida and Down syndrome.  HIV (human immunodeficiency virus) testing. Routine prenatal testing includes screening for HIV, unless you choose not to have this test. HOME CARE INSTRUCTIONS   Avoid all smoking, herbs, alcohol, and unprescribed drugs. These chemicals affect the formation and growth of the baby.  Do not use any tobacco products, including cigarettes, chewing tobacco, and electronic cigarettes. If you need help quitting, ask your health care provider. You may receive counseling support and other resources to help you quit.  Follow your health care provider's instructions regarding medicine use. There are medicines that are either safe or unsafe to take during pregnancy.  Exercise only as directed by your health care provider. Experiencing uterine cramps is a good sign to stop exercising.  Continue to eat regular, healthy meals.  Wear a good support bra for breast tenderness.  Do not use hot tubs, steam rooms, or saunas.  Wear your  seat belt at all times when driving.  Avoid raw meat, uncooked cheese, cat litter boxes, and soil used by cats. These carry germs that can cause birth defects in the baby.  Take your prenatal vitamins.  Take 1500-2000 mg of calcium daily starting at the 20th week of pregnancy until you deliver your baby.  Try taking a stool softener (if your health care provider approves) if you develop constipation. Eat more high-fiber foods, such as fresh vegetables or fruit and whole grains. Drink plenty of fluids to keep your urine clear or pale yellow.  Take warm sitz baths to soothe any pain or discomfort caused by hemorrhoids. Use hemorrhoid cream if your health care provider approves.  If you develop varicose veins, wear support  hose. Elevate your feet for 15 minutes, 3-4 times a day. Limit salt in your diet.  Avoid heavy lifting, wear low heel shoes, and practice good posture.  Rest with your legs elevated if you have leg cramps or low back pain.  Visit your dentist if you have not gone yet during your pregnancy. Use a soft toothbrush to brush your teeth and be gentle when you floss.  A sexual relationship may be continued unless your health care provider directs you otherwise.  Continue to go to all your prenatal visits as directed by your health care provider. SEEK MEDICAL CARE IF:   You have dizziness.  You have mild pelvic cramps, pelvic pressure, or nagging pain in the abdominal area.  You have persistent nausea, vomiting, or diarrhea.  You have a bad smelling vaginal discharge.  You have pain with urination. SEEK IMMEDIATE MEDICAL CARE IF:   You have a fever.  You are leaking fluid from your vagina.  You have spotting or bleeding from your vagina.  You have severe abdominal cramping or pain.  You have rapid weight gain or loss.  You have shortness of breath with chest pain.  You notice sudden or extreme swelling of your face, hands, ankles, feet, or legs.  You have not  felt your baby move in over an hour.  You have severe headaches that do not go away with medicine.  You have vision changes.   This information is not intended to replace advice given to you by your health care provider. Make sure you discuss any questions you have with your health care provider.   Document Released: 08/04/2001 Document Revised: 08/31/2014 Document Reviewed: 10/11/2012 Elsevier Interactive Patient Education 2016 [employer]. Preterm Labor Information Preterm labor is when labor starts at less than 37 weeks of pregnancy. The normal length of a pregnancy is 39 to 41 weeks. CAUSES Often, there is no identifiable underlying cause as to why a woman goes into preterm labor. One of the most common known causes of preterm labor is infection. Infections of the uterus, cervix, vagina, amniotic sac, bladder, kidney, or even the lungs (pneumonia) can cause labor to start. Other suspected causes of preterm labor include:   Urogenital infections, such as yeast infections and bacterial vaginosis.   Uterine abnormalities (uterine shape, uterine septum, fibroids, or bleeding from the placenta).   A cervix that has been operated on (it may fail to stay closed).   Malformations in the fetus.   Multiple gestations (twins, triplets, and so on).   Breakage of the amniotic sac.  RISK FACTORS 10. Having a previous history of preterm labor.  11. Having premature rupture of membranes (PROM).  12. Having a placenta that covers the opening of the cervix (placenta previa).  44. Having a placenta that separates from the uterus (placental abruption).  14. Having a cervix that is too weak to hold the fetus in the uterus (incompetent cervix).  15. Having too much fluid in the amniotic sac (polyhydramnios).  77. Taking illegal drugs or smoking while pregnant.  14. Not gaining enough weight while pregnant.  84. Being younger than 81 and older than 35 years old.  19. Having a low  socioeconomic status.  40. Being African American. SYMPTOMS Signs and symptoms of preterm labor include:   Menstrual-like cramps, abdominal pain, or back pain.  Uterine contractions that are regular, as frequent as six in an hour, regardless of their intensity (may be mild or painful).  Contractions that start on the  top of the uterus and spread down to the lower abdomen and back.   A sense of increased pelvic pressure.   A watery or bloody mucus discharge that comes from the vagina.  TREATMENT Depending on the length of the pregnancy and other circumstances, your health care provider may suggest bed rest. If necessary, there are medicines that can be given to stop contractions and to mature the fetal lungs. If labor happens before 34 weeks of pregnancy, a prolonged hospital stay may be recommended. Treatment depends on the condition of both you and the fetus.  WHAT SHOULD YOU DO IF YOU THINK YOU ARE IN PRETERM LABOR? Call your health care provider right away. You will need to go to the hospital to get checked immediately. HOW CAN YOU PREVENT PRETERM LABOR IN FUTURE PREGNANCIES? You should:   Stop smoking if you smoke.  Maintain healthy weight gain and avoid chemicals and drugs that are not necessary.  Be watchful for any type of infection.  Inform your health care provider if you have a known history of preterm labor.   This information is not intended to replace advice given to you by your health care provider. Make sure you discuss any questions you have with your health care provider.   Document Released: 10/31/2003 Document Revised: 04/12/2013 Document Reviewed: 09/12/2012 Elsevier Interactive Patient Education [employer].

## 2015-12-12 LAB — CULTURE, OB URINE

## 2015-12-13 LAB — PRESCRIPTION MONITORING PROFILE (19 PANEL)
AMPHETAMINE/METH: NEGATIVE ng/mL
BARBITURATE SCREEN, URINE: NEGATIVE ng/mL
BENZODIAZEPINE SCREEN, URINE: NEGATIVE ng/mL
Buprenorphine, Urine: NEGATIVE ng/mL
CANNABINOID SCRN UR: NEGATIVE ng/mL
CARISOPRODOL, URINE: NEGATIVE ng/mL
COCAINE METABOLITES: NEGATIVE ng/mL
CREATININE, URINE: 151.45 mg/dL (ref >20.0)
Fentanyl, Ur: NEGATIVE ng/mL
MDMA URINE: NEGATIVE ng/mL
Meperidine, Ur: NEGATIVE ng/mL
Methadone Screen, Urine: NEGATIVE ng/mL
Methaqualone: NEGATIVE ng/mL
Nitrites, Initial: NEGATIVE ug/mL
OPIATE SCREEN, URINE: NEGATIVE ng/mL
Oxycodone Screen, Ur: NEGATIVE ng/mL
PH URINE, INITIAL: 5.9 pH (ref 4.5–8.9)
PHENCYCLIDINE, UR: NEGATIVE ng/mL
Propoxyphene: NEGATIVE ng/mL
TAPENTADOLUR: NEGATIVE ng/mL
Tramadol Scrn, Ur: NEGATIVE ng/mL
ZOLPIDEM, URINE: NEGATIVE ng/mL

## 2015-12-17 ENCOUNTER — Encounter (HOSPITAL_COMMUNITY): Payer: Self-pay

## 2015-12-17 ENCOUNTER — Other Ambulatory Visit (HOSPITAL_COMMUNITY): Payer: Self-pay | Admitting: Maternal and Fetal Medicine

## 2015-12-17 ENCOUNTER — Encounter: Payer: Self-pay | Admitting: Family Medicine

## 2015-12-17 ENCOUNTER — Ambulatory Visit (HOSPITAL_COMMUNITY)
Admission: RE | Admit: 2015-12-17 | Discharge: 2015-12-17 | Disposition: A | Discharge status: Home / Self Care | Payer: Medicaid Other | Source: Ambulatory Visit | Attending: Obstetrics & Gynecology | Admitting: Obstetrics & Gynecology

## 2015-12-17 VITALS — BP 124/81 | HR 94 | Wt 154.4 lb

## 2015-12-17 DIAGNOSIS — O09212 Supervision of pregnancy with history of pre-term labor, second trimester: Secondary | ICD-10-CM

## 2015-12-17 DIAGNOSIS — IMO0001 Reserved for inherently not codable concepts without codable children: Secondary | ICD-10-CM

## 2015-12-17 DIAGNOSIS — O09292 Supervision of pregnancy with other poor reproductive or obstetric history, second trimester: Secondary | ICD-10-CM | POA: Diagnosis not present

## 2015-12-17 DIAGNOSIS — Z3A25 25 weeks gestation of pregnancy: Secondary | ICD-10-CM | POA: Insufficient documentation

## 2015-12-17 DIAGNOSIS — O0992 Supervision of high risk pregnancy, unspecified, second trimester: Secondary | ICD-10-CM

## 2015-12-17 DIAGNOSIS — O09522 Supervision of elderly multigravida, second trimester: Secondary | ICD-10-CM | POA: Diagnosis not present

## 2015-12-17 DIAGNOSIS — Z8759 Personal history of other complications of pregnancy, childbirth and the puerperium: Secondary | ICD-10-CM | POA: Insufficient documentation

## 2015-12-17 DIAGNOSIS — I10 Essential (primary) hypertension: Secondary | ICD-10-CM

## 2015-12-17 DIAGNOSIS — O09219 Supervision of pregnancy with history of pre-term labor, unspecified trimester: Secondary | ICD-10-CM

## 2015-12-17 DIAGNOSIS — O1012 Pre-existing hypertensive heart disease complicating childbirth: Secondary | ICD-10-CM | POA: Insufficient documentation

## 2015-12-17 DIAGNOSIS — O09899 Supervision of other high risk pregnancies, unspecified trimester: Secondary | ICD-10-CM | POA: Insufficient documentation

## 2016-01-07 ENCOUNTER — Encounter: Payer: Self-pay | Admitting: *Deleted

## 2016-01-07 ENCOUNTER — Ambulatory Visit (INDEPENDENT_AMBULATORY_CARE_PROVIDER_SITE_OTHER): Payer: Medicaid Other | Admitting: Student

## 2016-01-07 VITALS — BP 123/70 | HR 89 | Wt 156.5 lb

## 2016-01-07 DIAGNOSIS — O0992 Supervision of high risk pregnancy, unspecified, second trimester: Secondary | ICD-10-CM

## 2016-01-07 DIAGNOSIS — O26893 Other specified pregnancy related conditions, third trimester: Secondary | ICD-10-CM

## 2016-01-07 DIAGNOSIS — O09213 Supervision of pregnancy with history of pre-term labor, third trimester: Secondary | ICD-10-CM

## 2016-01-07 DIAGNOSIS — O133 Gestational [pregnancy-induced] hypertension without significant proteinuria, third trimester: Secondary | ICD-10-CM

## 2016-01-07 DIAGNOSIS — N898 Other specified noninflammatory disorders of vagina: Secondary | ICD-10-CM

## 2016-01-07 LAB — CBC
HCT: 29.5 % — ABNORMAL LOW (ref 35.0–45.0)
HEMOGLOBIN: 9.5 g/dL — AB (ref 11.7–15.5)
MCH: 22.9 pg — AB (ref 27.0–33.0)
MCHC: 32.2 g/dL (ref 32.0–36.0)
MCV: 71.1 fL — ABNORMAL LOW (ref 80.0–100.0)
MPV: 9.1 fL (ref 7.5–12.5)
PLATELETS: 291 10*3/uL (ref 140–400)
RBC: 4.15 MIL/uL (ref 3.80–5.10)
RDW: 15.2 % — ABNORMAL HIGH (ref 11.0–15.0)
WBC: 9.7 10*3/uL (ref 3.8–10.8)

## 2016-01-07 LAB — WET PREP, GENITAL: Trich, Wet Prep: NONE SEEN

## 2016-01-07 NOTE — Progress Notes (Signed)
1 hour due at 1118

## 2016-01-07 NOTE — Progress Notes (Signed)
  Subjective:  Kimberly Bennett is a 35 y.o. [id number] at [redacted]w[redacted]d being seen today for ongoing prenatal care.  She is currently monitored for the following issues for this high-risk pregnancy and has Asthma; Advanced maternal age in multigravida; Supervision of high-risk pregnancy; PIH (pregnancy induced hypertension), previous postpartum condition; Maternal varicella, non-immune; History of preterm delivery, currently pregnant; and History of prior pregnancy with IUGR newborn on her problem list.  Patient reports vaginal discharge. Described thick, yellow discharge for several days. Denies odor. Some vaginal irritation at times.   Contractions: Not present. Vag. Bleeding: None.  Movement: Present. Denies leaking of fluid.   The following portions of the patient's history were reviewed and updated as appropriate: allergies, current medications, past family history, past medical history, past social history, past surgical history and problem list. Problem list updated.  Objective:   Filed Vitals:   01/07/16 1016  BP: 123/70  Pulse: 89  Weight: 156 lb 8 oz (70.988 kg)    Fetal Status: Fetal Heart Rate (bpm): 144 Fundal Height: 30 cm Movement: Present     General:  Alert, oriented and cooperative. Patient is in no acute distress.  Skin: Skin is warm and dry. No rash noted.   Cardiovascular: Normal heart rate noted  Respiratory: Normal respiratory effort, no problems with respiration noted  Abdomen: Soft, gravid, appropriate for gestational age. Pain/Pressure: Absent     Pelvic: Vag. Bleeding: None     Cervical exam deferred        Extremities: Normal range of motion.  Edema: None  Mental Status: Normal mood and affect. Normal behavior. Normal judgment and thought content.   Urinalysis:      Assessment and Plan:  Pregnancy: [id number] at [redacted]w[redacted]d  1. Supervision of high-risk pregnancy, second trimester  - Glucose Tolerance, 1 HR (50g) w/o Fasting - CBC - RPR - HIV antibody (with  reflex)  2. Vaginal discharge during pregnancy in third trimester  - Wet prep, genital  Preterm labor symptoms and general obstetric precautions including but not limited to vaginal bleeding, contractions, leaking of fluid and fetal movement were reviewed in detail with the patient. Please refer to After Visit Summary for other counseling recommendations.  Return in about 2 weeks (around 01/21/2016) for Routine OB.   Jorje Guild, NP

## 2016-01-07 NOTE — Patient Instructions (Signed)

## 2016-01-08 LAB — HIV ANTIBODY (ROUTINE TESTING W REFLEX): HIV 1&2 Ab, 4th Generation: NONREACTIVE

## 2016-01-08 LAB — GLUCOSE TOLERANCE, 1 HOUR (50G) W/O FASTING: GLUCOSE, 1 HR, GESTATIONAL: 131 mg/dL (ref <140)

## 2016-01-09 LAB — RPR

## 2016-01-13 ENCOUNTER — Telehealth: Payer: Self-pay | Admitting: General Practice

## 2016-01-13 DIAGNOSIS — B9689 Other specified bacterial agents as the cause of diseases classified elsewhere: Secondary | ICD-10-CM

## 2016-01-13 DIAGNOSIS — B379 Candidiasis, unspecified: Secondary | ICD-10-CM

## 2016-01-13 DIAGNOSIS — N760 Acute vaginitis: Principal | ICD-10-CM

## 2016-01-13 MED ORDER — TERCONAZOLE 0.4 % VA CREA: 1.0000 | TOPICAL_CREAM | Freq: Every day | VAGINAL | Status: AC

## 2016-01-13 MED ORDER — METRONIDAZOLE 500 MG PO TABS: 500.0000 mg | ORAL_TABLET | Freq: Two times a day (BID) | ORAL | Status: DC

## 2016-01-13 MED ORDER — METRONIDAZOLE 0.75 % VA GEL: 1.0000 | Freq: Every day | VAGINAL | Status: DC

## 2016-01-13 NOTE — Telephone Encounter (Signed)
Per chart review, wet prep shows yeast & BV. meds ordered. Called patient, no answer- left message stating we are trying to reach you with results, please call us back at the clinics. Will send mychart message

## 2016-01-13 NOTE — Telephone Encounter (Signed)
Patient called back into front office. Informed her of results & medications sent to pharmacy. Patient verbalized understanding & asked for the vaginal cream instead of the pills. Told patient we can send that to her pharmacy but I was uncertain if her insurance covered that. Patient asked to not cancel the pill prescription in case insurance didn't cover the gel. Told patient I could do that. Patient had no other questions

## 2016-01-14 ENCOUNTER — Other Ambulatory Visit (HOSPITAL_COMMUNITY): Payer: Self-pay | Admitting: Maternal and Fetal Medicine

## 2016-01-14 ENCOUNTER — Ambulatory Visit (HOSPITAL_COMMUNITY)
Admission: RE | Admit: 2016-01-14 | Discharge: 2016-01-14 | Disposition: A | Discharge status: Home / Self Care | Payer: Medicaid Other | Source: Ambulatory Visit | Attending: Obstetrics & Gynecology | Admitting: Obstetrics & Gynecology

## 2016-01-14 ENCOUNTER — Encounter (HOSPITAL_COMMUNITY): Payer: Self-pay

## 2016-01-14 VITALS — BP 121/66 | HR 90 | Wt 159.1 lb

## 2016-01-14 DIAGNOSIS — IMO0001 Reserved for inherently not codable concepts without codable children: Secondary | ICD-10-CM

## 2016-01-14 DIAGNOSIS — O09523 Supervision of elderly multigravida, third trimester: Secondary | ICD-10-CM

## 2016-01-14 DIAGNOSIS — O09293 Supervision of pregnancy with other poor reproductive or obstetric history, third trimester: Secondary | ICD-10-CM | POA: Diagnosis not present

## 2016-01-14 DIAGNOSIS — O09213 Supervision of pregnancy with history of pre-term labor, third trimester: Secondary | ICD-10-CM | POA: Diagnosis not present

## 2016-01-14 DIAGNOSIS — Z8759 Personal history of other complications of pregnancy, childbirth and the puerperium: Secondary | ICD-10-CM

## 2016-01-14 DIAGNOSIS — Z3A29 29 weeks gestation of pregnancy: Secondary | ICD-10-CM

## 2016-01-14 DIAGNOSIS — O163 Unspecified maternal hypertension, third trimester: Secondary | ICD-10-CM

## 2016-01-14 DIAGNOSIS — O09522 Supervision of elderly multigravida, second trimester: Secondary | ICD-10-CM

## 2016-01-14 DIAGNOSIS — O10013 Pre-existing essential hypertension complicating pregnancy, third trimester: Secondary | ICD-10-CM | POA: Diagnosis not present

## 2016-01-14 DIAGNOSIS — O09893 Supervision of other high risk pregnancies, third trimester: Secondary | ICD-10-CM

## 2016-01-14 DIAGNOSIS — O0992 Supervision of high risk pregnancy, unspecified, second trimester: Secondary | ICD-10-CM

## 2016-01-28 ENCOUNTER — Ambulatory Visit (INDEPENDENT_AMBULATORY_CARE_PROVIDER_SITE_OTHER): Payer: Medicaid Other | Admitting: Student

## 2016-01-28 ENCOUNTER — Encounter: Payer: Self-pay | Admitting: Student

## 2016-01-28 VITALS — BP 117/74 | HR 98 | Wt 157.4 lb

## 2016-01-28 DIAGNOSIS — O09213 Supervision of pregnancy with history of pre-term labor, third trimester: Secondary | ICD-10-CM

## 2016-01-28 DIAGNOSIS — O0993 Supervision of high risk pregnancy, unspecified, third trimester: Secondary | ICD-10-CM

## 2016-01-28 LAB — POCT URINALYSIS DIP (DEVICE)
Glucose, UA: NEGATIVE mg/dL
Nitrite: NEGATIVE
PH: 5.5 (ref 5.0–8.0)
PROTEIN: 30 mg/dL — AB
Urobilinogen, UA: 1 mg/dL (ref 0.0–1.0)

## 2016-01-28 NOTE — Progress Notes (Signed)
Pt reporting back pain.

## 2016-01-28 NOTE — Progress Notes (Signed)
Lower pressure hip pain/pelvic pain Maternity support belt Subjective:  Kimberly Bennett is a 35 y.o. [phone] at [redacted]w[redacted]d being seen today for ongoing prenatal care.  She is currently monitored for the following issues for this high-risk pregnancy and has Asthma; Advanced maternal age in multigravida; Supervision of high-risk pregnancy; PIH (pregnancy induced hypertension), previous postpartum condition; Maternal varicella, non-immune; History of preterm delivery, currently pregnant; and History of prior pregnancy with IUGR newborn on her problem list.  Patient reports lower abdominal pain, low back pain, & vaginal pressure. Symptoms have been ongoing; has been wearing maternity support belt at work without much relief. 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  Contractions: Not present. Vag. Bleeding: None.  Movement: Present. Denies leaking of fluid.   The following portions of the patient's history were reviewed and updated as appropriate: allergies, current medications, past family history, past medical history, past social history, past surgical history and problem list. Problem list updated.  Objective:   Filed Vitals:   01/28/16 0928  BP: 117/74  Pulse: 98  Weight: 157 lb 6.4 oz (71.396 kg)    Fetal Status: Fetal Heart Rate (bpm): 142 Fundal Height: 32 cm Movement: Present     General:  Alert, oriented and cooperative. Patient is in no acute distress.  Skin: Skin is warm and dry. No rash noted.   Cardiovascular: Normal heart rate noted  Respiratory: Normal respiratory effort, no problems with respiration noted  Abdomen: Soft, gravid, appropriate for gestational age. Pain/Pressure: Present     Pelvic: Vag. Bleeding: None     Cervical exam performed Dilation: Closed Effacement (%): 0 Station: Ballotable  Extremities: Normal range of motion.  Edema: None  Mental Status: Normal mood and affect. Normal behavior. Normal judgment and  thought content.   Urinalysis:      Assessment and Plan:  Pregnancy: [id number] at [redacted]w[redacted]d  1. Supervision of high-risk pregnancy, third trimester  -Continue maternity support belt use daily -Restriction not provided for patient to give to her job  Preterm labor symptoms and general obstetric precautions including but not limited to vaginal bleeding, contractions, leaking of fluid and fetal movement were reviewed in detail with the patient. Please refer to After Visit Summary for other counseling recommendations.  Return in about 2 weeks (around 02/11/2016) for Routine OB.   Jorje Guild, NP

## 2016-01-28 NOTE — Patient Instructions (Signed)
Third Trimester of Pregnancy The third trimester is from week 29 through week 42, months 7 through 9. This trimester is when your unborn baby (fetus) is growing very fast. At the end of the ninth month, the unborn baby is about 20 inches in length. It weighs about 6-10 pounds.  HOME CARE   Avoid all smoking, herbs, and alcohol. Avoid drugs not approved by your doctor.  Do not use any tobacco products, including cigarettes, chewing tobacco, and electronic cigarettes. If you need help quitting, ask your doctor. You may get counseling or other support to help you quit.  Only take medicine as told by your doctor. Some medicines are safe and some are not during pregnancy.  Exercise only as told by your doctor. Stop exercising if you start having cramps.  Eat regular, healthy meals.  Wear a good support bra if your breasts are tender.  Do not use hot tubs, steam rooms, or saunas.  Wear your seat belt when driving.  Avoid raw meat, uncooked cheese, and liter boxes and soil used by cats.  Take your prenatal vitamins.  Take 1500-2000 milligrams of calcium daily starting at the 20th week of pregnancy until you deliver your baby.  Try taking medicine that helps you poop (stool softener) as needed, and if your doctor approves. Eat more fiber by eating fresh fruit, vegetables, and whole grains. Drink enough fluids to keep your pee (urine) clear or pale yellow.  Take warm water baths (sitz baths) to soothe pain or discomfort caused by hemorrhoids. Use hemorrhoid cream if your doctor approves.  If you have puffy, bulging veins (varicose veins), wear support hose. Raise (elevate) your feet for 15 minutes, 3-4 times a day. Limit salt in your diet.  Avoid heavy lifting, wear low heels, and sit up straight.  Rest with your legs raised if you have leg cramps or low back pain.  Visit your dentist if you have not gone during your pregnancy. Use a soft toothbrush to brush your teeth. Be gentle when you  floss.  You can have sex (intercourse) unless your doctor tells you not to.  Do not travel far distances unless you must. Only do so with your doctor's approval.  Take prenatal classes.  Practice driving to the hospital.  Pack your hospital bag.  Prepare the baby's room.  Go to your doctor visits. GET HELP IF:  You are not sure if you are in labor or if your water has broken.  You are dizzy.  You have mild cramps or pressure in your lower belly (abdominal).  You have a nagging pain in your belly area.  You continue to feel sick to your stomach (nauseous), throw up (vomit), or have watery poop (diarrhea).  You have bad smelling fluid coming from your vagina.  You have pain with peeing (urination). GET HELP RIGHT AWAY IF:   You have a fever.  You are leaking fluid from your vagina.  You are spotting or bleeding from your vagina.  You have severe belly cramping or pain.  You lose or gain weight rapidly.  You have trouble catching your breath and have chest pain.  You notice sudden or extreme puffiness (swelling) of your face, hands, ankles, feet, or legs.  You have not felt the baby move in over an hour.  You have severe headaches that do not go away with medicine.  You have vision changes.   This information is not intended to replace advice given to you by your health care  provider. Make sure you discuss any questions you have with your health care provider.   Document Released: 11/04/2009 Document Revised: 08/31/2014 Document Reviewed: 10/11/2012 Elsevier Interactive Patient Education 2016 [employer]. Preterm Labor Information Preterm labor is when labor starts before you are [redacted] weeks pregnant. The normal length of pregnancy is 39 to 41 weeks.  CAUSES  The cause of preterm labor is not often known. The most common known cause is infection. RISK FACTORS  Having a history of preterm labor.  Having your water break before it should.  Having a placenta  that covers the opening of the cervix.  Having a placenta that breaks away from the uterus.  Having a cervix that is too weak to hold the baby in the uterus.  Having too much fluid in the amniotic sac.  Taking drugs or smoking while pregnant.  Not gaining enough weight while pregnant.  Being younger than 36 and older than 35 years old.  Having a low income.  Being African American. SYMPTOMS  Period-like cramps, belly (abdominal) pain, or back pain.  Contractions that are regular, as often as six in an hour. They may be mild or painful.  Contractions that start at the top of the belly. They then move to the lower belly and back.  Lower belly pressure that seems to get stronger.  Bleeding from the vagina.  Fluid leaking from the vagina. TREATMENT  Treatment depends on:  Your condition.  The condition of your baby.  How many weeks pregnant you are. Your doctor may have you:  Take medicine to stop contractions.  Stay in bed except to use the restroom (bed rest).  Stay in the hospital. WHAT SHOULD YOU DO IF YOU THINK YOU ARE IN PRETERM LABOR? Call your doctor right away. You need to go to the hospital right away.  HOW CAN YOU PREVENT PRETERM LABOR IN FUTURE PREGNANCIES?  Stop smoking, if you smoke.  Maintain healthy weight gain.  Do not take drugs or be around chemicals that are not needed.  Tell your doctor if you think you have an infection.  Tell your doctor if you had a preterm labor before.   This information is not intended to replace advice given to you by your health care provider. Make sure you discuss any questions you have with your health care provider.   Document Released: 11/06/2008 Document Revised: 12/25/2014 Document Reviewed: 09/12/2012 Elsevier Interactive Patient Education [employer].

## 2016-02-11 ENCOUNTER — Ambulatory Visit (HOSPITAL_COMMUNITY)
Admission: RE | Admit: 2016-02-11 | Discharge: 2016-02-11 | Disposition: A | Discharge status: Home / Self Care | Payer: Medicaid Other | Source: Ambulatory Visit | Attending: Obstetrics & Gynecology | Admitting: Obstetrics & Gynecology

## 2016-02-11 ENCOUNTER — Encounter (HOSPITAL_COMMUNITY): Payer: Self-pay

## 2016-02-11 ENCOUNTER — Ambulatory Visit (INDEPENDENT_AMBULATORY_CARE_PROVIDER_SITE_OTHER): Payer: Medicaid Other | Admitting: Advanced Practice Midwife

## 2016-02-11 ENCOUNTER — Other Ambulatory Visit (HOSPITAL_COMMUNITY): Payer: Self-pay | Admitting: Maternal and Fetal Medicine

## 2016-02-11 VITALS — BP 124/77 | HR 90 | Temp 98.0°F | Wt 163.5 lb

## 2016-02-11 DIAGNOSIS — O09893 Supervision of other high risk pregnancies, third trimester: Secondary | ICD-10-CM

## 2016-02-11 DIAGNOSIS — O26893 Other specified pregnancy related conditions, third trimester: Secondary | ICD-10-CM

## 2016-02-11 DIAGNOSIS — O09523 Supervision of elderly multigravida, third trimester: Secondary | ICD-10-CM

## 2016-02-11 DIAGNOSIS — Z3A33 33 weeks gestation of pregnancy: Secondary | ICD-10-CM | POA: Insufficient documentation

## 2016-02-11 DIAGNOSIS — R102 Pelvic and perineal pain: Secondary | ICD-10-CM

## 2016-02-11 DIAGNOSIS — O10013 Pre-existing essential hypertension complicating pregnancy, third trimester: Secondary | ICD-10-CM | POA: Insufficient documentation

## 2016-02-11 DIAGNOSIS — O133 Gestational [pregnancy-induced] hypertension without significant proteinuria, third trimester: Secondary | ICD-10-CM

## 2016-02-11 DIAGNOSIS — IMO0001 Reserved for inherently not codable concepts without codable children: Secondary | ICD-10-CM

## 2016-02-11 DIAGNOSIS — O09213 Supervision of pregnancy with history of pre-term labor, third trimester: Secondary | ICD-10-CM | POA: Insufficient documentation

## 2016-02-11 DIAGNOSIS — O09522 Supervision of elderly multigravida, second trimester: Secondary | ICD-10-CM

## 2016-02-11 DIAGNOSIS — O0993 Supervision of high risk pregnancy, unspecified, third trimester: Secondary | ICD-10-CM

## 2016-02-11 DIAGNOSIS — O09293 Supervision of pregnancy with other poor reproductive or obstetric history, third trimester: Secondary | ICD-10-CM | POA: Insufficient documentation

## 2016-02-11 DIAGNOSIS — O10913 Unspecified pre-existing hypertension complicating pregnancy, third trimester: Secondary | ICD-10-CM

## 2016-02-11 NOTE — Progress Notes (Signed)
Subjective:  Kimberly Bennett is a 35 y.o. [id number] at [redacted]w[redacted]d being seen today for ongoing prenatal care.  She is currently monitored for the following issues for this high-risk pregnancy and has Asthma; Advanced maternal age in multigravida; Supervision of high-risk pregnancy; PIH (pregnancy induced hypertension), previous postpartum condition; Maternal varicella, non-immune; History of preterm delivery, currently pregnant; and History of prior pregnancy with IUGR newborn on her problem list.  Patient reports pelvic pain when standing at work, occasional braxton-hicks contractions.  Contractions: Irregular. Vag. Bleeding: None.  Movement: Present. Denies leaking of fluid.   The following portions of the patient's history were reviewed and updated as appropriate: allergies, current medications, past family history, past medical history, past social history, past surgical history and problem list. Problem list updated.  Objective:   Filed Vitals:   02/11/16 0900  BP: 124/77  Pulse: 90  Temp: 98 F (36.7 C)  Weight: 163 lb 8 oz (74.163 kg)    Fetal Status: Fetal Heart Rate (bpm): 136 Fundal Height: 35 cm Movement: Present     General:  Alert, oriented and cooperative. Patient is in no acute distress.  Skin: Skin is warm and dry. No rash noted.   Cardiovascular: Normal heart rate noted  Respiratory: Normal respiratory effort, no problems with respiration noted  Abdomen: Soft, gravid, appropriate for gestational age. Pain/Pressure: Present     Pelvic: Cervical exam deferred        Extremities: Normal range of motion.  Edema: Trace  Mental Status: Normal mood and affect. Normal behavior. Normal judgment and thought content.   Urinalysis:      Assessment and Plan:  Pregnancy: [id number] at [redacted]w[redacted]d  1. Supervision of high-risk pregnancy, third trimester --Hx of preterm delivery x 1 after PPROM, then term delivery x 3.    2. Pelvic pain affecting pregnancy in third trimester, antepartum --Pt  wearing pregnancy support belt but doesn't feel it is supportive enough.  Discussed other types of support belts, recommend rest/ice/heat/warm bath, stretching exercises for pelvic/back pain in pregnancy.  Preterm labor symptoms and general obstetric precautions including but not limited to vaginal bleeding, contractions, leaking of fluid and fetal movement were reviewed in detail with the patient. Please refer to After Visit Summary for other counseling recommendations.  Return in about 2 weeks (around 02/25/2016).   Elvera Maria, CNM

## 2016-02-11 NOTE — Progress Notes (Signed)
[facility] did not provide a urine specimen today- states unable to go.

## 2016-02-11 NOTE — Progress Notes (Signed)
C/o braxton hicks 3-4 times a day.

## 2016-02-11 NOTE — Patient Instructions (Addendum)
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  What are the symptoms of SPD?  What causes SPD?  How is SPD diagnosed?  How is SPD treated?  What can I do to ease the pain of SPD?  Will I recover from SPD after I've had my baby ?  Where can I get help and support? Video  Your pelvic floor Finding and exercising this crucial area. What is symphysis pubis dysfunction?Symphysis pubis dysfunction (SPD) is a problem with the pelvis. Your pelvis is mainly formed of two pubic bones that curve round to make a cradle shape. The pubic bones meet at the front of your pelvis, at a firm joint called the symphysis pubis.   The joint's connection is made strong by a dense network of tough tissues (ligaments). During pregnancy, swelling and pain can make the symphysis pubis joint less stable, causing SPD.  Doctors and physiotherapists classify any type of pelvic pain during pregnancy as pelvic girdle pain (PGP).   SPD is one type of pelvic girdle pain. Diastasis symphysis pubis (DSP) is another type of pelvic girdle pain, which is related to SPD. DSP happens when the gap in the symphysis pubis joint widens too far. DSP is rare, and can only be diagnosed by an X-ray, ultrasound scan or MRI scan. What are the symptoms of SPD? Pain in the pubic area and groin are the most common symptoms, though you may also notice:   Back pain, pain at the back of your pelvis or hip pain.  Pain, along with a grinding or clicking sensation in your pubic area.  Pain down the inside of your thighs or between your legs.  Pain that's made worse by parting your legs, walking, going up or down stairs or moving around in bed.  Pain that's worse at night and stops you from sleeping well. Getting up to go to the toilet in the middle of the night can be especially  painful.  SPD can occur at any time during your pregnancy or after giving birth. You may notice it for the first time during the middle of your pregnancy. What causes SPD?During pregnancy, your body produces a hormone called relaxin, which softens your ligaments to help your baby pass through your pelvis. This means that the joints in your pelvis naturally become more lax.   However, this flexibility doesn't necessarily cause the painful problems of SPD. Usually, your nerves and muscles are able to adapt and compensate for the greater flexibility in your joints. This means your body should cope well with the changes to your posture as your baby grows.  SPD is thought to happen when your body doesn't adapt so well to the stretchier, looser ligaments caused by relaxin. SPD can be triggered by:  the joints in your pelvis moving unevenly  changes to the way your muscles work to support your pelvic girdle joints  one pelvic joint not working properly and causing knock-on pain in the other joints of your pelvis  These problems mean that your pelvis is not as stable as it should be, and this is what causes SPD. Physiotherapy is the best way to treat SPD, because it's about the relationship between your muscles and bones, rather than how lax your joints are. You're more likely to develop SPD if:  you had pelvic girdle pain or pelvic  joint pain before you became pregnant  you've had a previous injury to your pelvis  you've had pelvic girdle pain in a previous pregnancy  you have a high BMI and were overweight before you became pregnant  hypermobility in all your joints  How is SPD diagnosed? Your doctor or midwife should refer you to a women's health physiotherapist. Your physiotherapist will test the stability, movement and pain in your pelvic joints and muscles. How is SPD treated?SPD is managed in the same way as other pelvic girdle pain. Treatment includes:   Exercises to strengthen your spinal,  tummy, pelvic girdle, hip and pelvic floor muscles. These will improve the stability of your pelvis and back. You may need gentle, hands-on treatment of your hip, back or pelvis to correct stiffness or imbalance. Water gymnastics can sometimes help.  Your physiotherapist should advise you on how to make daily activities less painful and on how to make the birth of your baby easier. Your midwife should help you to write a birth plan that takes into account your SPD symptoms.  Acupuncture may help reduce the pain and is safe during pregnancy. Make sure your practitioner is trained and experienced in working with pregnant women.  Other manual therapies, such as osteopathy may help. See a registered practitioner who is experienced in treating pregnant women.  A pelvic support belt may give relief, particularly when you're exercising or active. What can I do to ease the pain of SPD?  Be as active as you can, but don't push yourself so far that it hurts.  Stick to the pelvic floor and tummy exercises that your physiotherapist recommends.  Ask for and accept offers of help with daily chores.  Plan ahead so that you reduce the activities that cause you problems. You could use a rucksack to carry things around, both indoors and out.  Take care to part your legs no further than your pain-free range, particularly when getting in and out of the car, bed or bath. If you are lying down, pull up your knees as far as you can to make it easier to part your legs. If you are sitting, try arching your back and sticking your chest out before parting or moving your legs.  Avoid activities that make your pain worse or that put your pelvis in an uneven position, such as sitting cross-legged or carrying your toddler on your hip. If something hurts, stop doing it. If the pain is allowed to flare up, it can take a long time to settle down again.  Try to sleep on your side with legs bent and a pillow between your knees.  Rest  regularly or sit down for activities you would normally do standing, such as ironing. By sitting on a birth ball or by getting down on your hands and knees, you'll take the weight of your baby off your pelvis.  Try not to do heavy lifting or pushing. Pushing supermarket trolleys can often make your pain worse, so shop online or ask someone to shop for you.  When climbing stairs, take one step at a time. Step up onto one step with your best leg and then bring your other leg to meet it. Repeat with each step.  Avoid standing on one leg. When getting dressed, sit down to pull on your knickers or trousers. Will I recover from SPD after I've had my baby ? You're very likely to recover within a few weeks to a few months after your baby is  born. If you can, carry on with physiotherapy after the birth. Try to get help with looking after your baby during the early weeks.   You may find you get twinges every month just before your period is due. This is likely to be caused by hormones that have a similar effect to pregnancy hormones.   If you have SPD in one pregnancy, it is more likely that you'll have it next time you get pregnant. Ask your midwife to refer you to a physiotherapist early on. SPD may not necessarily be as bad next time if it is well managed from the start of pregnancy.   You could consider giving yourself a bit of time from one pregnancy to the next. Losing excess weight, getting fit and waiting until your children can walk may help reduce the symptoms of getting any type of pelvic pain next time. Where can I get help and support?The [employer] of [employer] in [facility] can provide a list of physiotherapists in your area.  You can get in touch with other women in your situation by contacting [facility] [employer], a charity that offers support to women with pelvic girdle pain, including SPD, or by visiting our community.   See our photo guide to pregnancy stretches,  designed to help relieve those aches and pains.   Last reviewed: July 2015  Back Pain in Pregnancy Back pain during pregnancy is common. It happens in about half of all pregnancies. It is important for you and your baby that you remain active during your pregnancy.If you feel that back pain is not allowing you to remain active or sleep well, it is time to see your caregiver. Back pain may be caused by several factors related to changes during your pregnancy.Fortunately, unless you had trouble with your back before your pregnancy, the pain is likely to get better after you deliver. Low back pain usually occurs between the fifth and seventh months of pregnancy. It can, however, happen in the first couple months. Factors that increase the risk of back problems include:   Previous back problems.  Injury to your back.  Having twins or multiple births.  A chronic cough.  Stress.  Job-related repetitive motions.  Muscle or spinal disease in the back.  Family history of back problems, ruptured (herniated) discs, or osteoporosis.  Depression, anxiety, and panic attacks. CAUSES   When you are pregnant, your body produces a hormone called relaxin. This hormonemakes the ligaments connecting the low back and pubic bones more flexible. This flexibility allows the baby to be delivered more easily. When your ligaments are loose, your muscles need to work harder to support your back. Soreness in your back can come from tired muscles. Soreness can also come from back tissues that are irritated since they are receiving less support.  As the baby grows, it puts pressure on the nerves and blood vessels in your pelvis. This can cause back pain.  As the baby grows and gets heavier during pregnancy, the uterus pushes the stomach muscles forward and changes your center of gravity. This makes your back muscles work harder to maintain good posture. SYMPTOMS  Lumbar pain during pregnancy Lumbar pain during  pregnancy usually occurs at or above the waist in the center of the back. There may be pain and numbness that radiates into your leg or foot. This is similar to low back pain experienced by non-pregnant women. It usually increases with sitting for long periods of time, standing, or repetitive lifting.  Tenderness may also be present in the muscles along your upper back. Posterior pelvic pain during pregnancy Pain in the back of the pelvis is more common than lumbar pain in pregnancy. It is a deep pain felt in your side at the waistline, or across the tailbone (sacrum), or in both places. You may have pain on one or both sides. This pain can also go into the buttocks and backs of the upper thighs. Pubic and groin pain may also be present. The pain does not quickly resolve with rest, and morning stiffness may also be present. Pelvic pain during pregnancy can be brought on by most activities. A high level of fitness before and during pregnancy may or may not prevent this problem. Labor pain is usually 1 to 2 minutes apart, lasts for about 1 minute, and involves a bearing down feeling or pressure in your pelvis. However, if you are at term with the pregnancy, constant low back pain can be the beginning of early labor, and you should be aware of this. DIAGNOSIS  X-rays of the back should not be done during the first 12 to 14 weeks of the pregnancy and only when absolutely necessary during the rest of the pregnancy. MRIs do not give off radiation and are safe during pregnancy. MRIs also should only be done when absolutely necessary. HOME CARE INSTRUCTIONS  Exercise as directed by your caregiver. Exercise is the most effective way to prevent or manage back pain. If you have a back problem, it is especially important to avoid sports that require sudden body movements. Swimming and walking are great activities.  Do not stand in one place for long periods of time.  Do not wear high heels.  Sit in chairs with good  posture. Use a pillow on your lower back if necessary. Make sure your head rests over your shoulders and is not hanging forward.  Try sleeping on your side, preferably the left side, with a pillow or two between your legs. If you are sore after a night's rest, your bedmay betoo soft.Try placing a board between your mattress and box spring.  Listen to your body when lifting.If you are experiencing pain, ask for help or try bending yourknees more so you can use your leg muscles rather than your back muscles. Squat down when picking up something from the floor. Do not bend over.  Eat a healthy diet. Try to gain weight within your caregiver's recommendations.  Use heat or cold packs 3 to 4 times a day for 15 minutes to help with the pain.  Only take over-the-counter or prescription medicines for pain, discomfort, or fever as directed by your caregiver. Sudden (acute) back pain  Use bed rest for only the most extreme, acute episodes of back pain. Prolonged bed rest over 48 hours will aggravate your condition.  Ice is very effective for acute conditions.  Put ice in a plastic bag.  Place a towel between your skin and the bag.  Leave the ice on for 10 to 20 minutes every 2 hours, or as needed.  Using heat packs for 30 minutes prior to activities is also helpful. Continued back pain See your caregiver if you have continued problems. Your caregiver can help or refer you for appropriate physical therapy. With conditioning, most back problems can be avoided. Sometimes, a more serious issue may be the cause of back pain. You should be seen right away if new problems seem to be developing. Your caregiver may recommend:  A maternity  girdle.  An elastic sling.  A back brace.  A massage therapist or acupuncture. SEEK MEDICAL CARE IF:   You are not able to do most of your daily activities, even when taking the pain medicine you were given.  You need a referral to a physical therapist or  chiropractor.  You want to try acupuncture. SEEK IMMEDIATE MEDICAL CARE IF:  You develop numbness, tingling, weakness, or problems with the use of your arms or legs.  You develop severe back pain that is no longer relieved with medicines.  You have a sudden change in bowel or bladder control.  You have increasing pain in other areas of the body.  You develop shortness of breath, dizziness, or fainting.  You develop nausea, vomiting, or sweating.  You have back pain which is similar to labor pains.  You have back pain along with your water breaking or vaginal bleeding.  You have back pain or numbness that travels down your leg.  Your back pain developed after you fell.  You develop pain on one side of your back. You may have a kidney stone.  You see blood in your urine. You may have a bladder infection or kidney stone.  You have back pain with blisters. You may have shingles. Back pain is fairly common during pregnancy but should not be accepted as just part of the process. Back pain should always be treated as soon as possible. This will make your pregnancy as pleasant as possible.   This information is not intended to replace advice given to you by your health care provider. Make sure you discuss any questions you have with your health care provider.   Document Released: 11/18/2005 Document Revised: 11/02/2011 Document Reviewed: 12/30/2010 Elsevier Interactive Patient Education [employer].

## 2016-02-23 ENCOUNTER — Inpatient Hospital Stay (HOSPITAL_COMMUNITY)
Admission: AD | Admit: 2016-02-23 | Discharge: 2016-02-23 | Disposition: A | Discharge status: Home / Self Care | Payer: Medicaid Other | Source: Ambulatory Visit | Attending: Obstetrics & Gynecology | Admitting: Obstetrics & Gynecology

## 2016-02-23 ENCOUNTER — Encounter (HOSPITAL_COMMUNITY): Payer: Self-pay | Admitting: *Deleted

## 2016-02-23 DIAGNOSIS — Z87440 Personal history of urinary (tract) infections: Secondary | ICD-10-CM | POA: Insufficient documentation

## 2016-02-23 DIAGNOSIS — R102 Pelvic and perineal pain: Secondary | ICD-10-CM | POA: Diagnosis not present

## 2016-02-23 DIAGNOSIS — Z811 Family history of alcohol abuse and dependence: Secondary | ICD-10-CM | POA: Diagnosis not present

## 2016-02-23 DIAGNOSIS — O26893 Other specified pregnancy related conditions, third trimester: Secondary | ICD-10-CM | POA: Diagnosis not present

## 2016-02-23 DIAGNOSIS — Z3A35 35 weeks gestation of pregnancy: Secondary | ICD-10-CM | POA: Insufficient documentation

## 2016-02-23 DIAGNOSIS — Z833 Family history of diabetes mellitus: Secondary | ICD-10-CM | POA: Insufficient documentation

## 2016-02-23 DIAGNOSIS — Z91040 Latex allergy status: Secondary | ICD-10-CM | POA: Diagnosis not present

## 2016-02-23 DIAGNOSIS — O133 Gestational [pregnancy-induced] hypertension without significant proteinuria, third trimester: Secondary | ICD-10-CM | POA: Insufficient documentation

## 2016-02-23 DIAGNOSIS — Z825 Family history of asthma and other chronic lower respiratory diseases: Secondary | ICD-10-CM | POA: Insufficient documentation

## 2016-02-23 DIAGNOSIS — K6289 Other specified diseases of anus and rectum: Secondary | ICD-10-CM | POA: Insufficient documentation

## 2016-02-23 DIAGNOSIS — Z880 Allergy status to penicillin: Secondary | ICD-10-CM | POA: Insufficient documentation

## 2016-02-23 DIAGNOSIS — Z8619 Personal history of other infectious and parasitic diseases: Secondary | ICD-10-CM | POA: Diagnosis not present

## 2016-02-23 DIAGNOSIS — Z8249 Family history of ischemic heart disease and other diseases of the circulatory system: Secondary | ICD-10-CM | POA: Insufficient documentation

## 2016-02-23 DIAGNOSIS — N949 Unspecified condition associated with female genital organs and menstrual cycle: Secondary | ICD-10-CM | POA: Diagnosis not present

## 2016-02-23 LAB — URINALYSIS, ROUTINE W REFLEX MICROSCOPIC
BILIRUBIN URINE: NEGATIVE
Glucose, UA: NEGATIVE mg/dL
Ketones, ur: NEGATIVE mg/dL
NITRITE: NEGATIVE
PH: 6 (ref 5.0–8.0)
Protein, ur: NEGATIVE mg/dL
SPECIFIC GRAVITY, URINE: 1.025 (ref 1.005–1.030)

## 2016-02-23 LAB — URINE MICROSCOPIC-ADD ON: RBC / HPF: NONE SEEN RBC/hpf (ref 0–5)

## 2016-02-23 NOTE — MAU Provider Note (Signed)
History   [id number] @ 35.3 wks in with c/o rectal and pelvic since yesterday. States last BM this morning. Good fetal movement per pt. Denies ROM or vag bleeding.  CSN: [id number]  Arrival date & time 02/23/16  1343   First Provider Initiated Contact with Patient 02/23/16 1434      Chief Complaint  Patient presents with  . Rectal Pain    HPI  Past Medical History  Diagnosis Date  . Urinary tract infection   . Chlamydia   . Trichomonas   . Abnormal Pap smear of cervix     colposcopy 2012, Jan  . Pregnancy induced hypertension     Past Surgical History  Procedure Laterality Date  . Colposcopy    . Dilation and curettage of uterus      Family History  Problem Relation Age of Onset  . Hypertension Mother   . Diabetes Maternal Aunt   . Stroke Maternal Aunt   . Heart disease Maternal Aunt   . Hypertension Maternal Grandmother   . Diabetes Maternal Grandmother   . Asthma Daughter   . Asthma Son   . Alcohol abuse Father     Social History  Substance Use Topics  . Smoking status: Never Smoker   . Smokeless tobacco: Never Used  . Alcohol Use: No    OB History    Gravida Para Term Preterm AB TAB SAB Ectopic Multiple Living   8 4 3 1 3 1 2  0 0 4      Review of Systems  Constitutional: Negative.   HENT: Negative.   Eyes: Negative.   Respiratory: Negative.   Cardiovascular: Negative.   Gastrointestinal:       Rectal and pelvic pressure  Endocrine: Negative.   Genitourinary: Negative.   Musculoskeletal: Negative.   Skin: Negative.   Allergic/Immunologic: Negative.   Neurological: Negative.   Hematological: Negative.   Psychiatric/Behavioral: Negative.     Allergies  Penicillins and Latex  Home Medications  No current outpatient prescriptions on file.  BP 119/64 mmHg  Pulse 103  Temp(Src) 98.4 F (36.9 C) (Oral)  Resp 18  LMP 06/20/2015  Physical Exam  Constitutional: She is oriented to person, place, and time. She appears well-developed and  well-nourished.  HENT:  Head: Normocephalic.  Eyes: Pupils are equal, round, and reactive to light.  Neck: Normal range of motion.  Cardiovascular: Normal rate, regular rhythm, normal heart sounds and intact distal pulses.   Pulmonary/Chest: Effort normal and breath sounds normal.  Abdominal: Soft. Bowel sounds are normal.  Genitourinary: Vagina normal and uterus normal.  Musculoskeletal: Normal range of motion.  Neurological: She is alert and oriented to person, place, and time. She has normal reflexes.  Skin: Skin is warm and dry.  Psychiatric: She has a normal mood and affect. Her behavior is normal. Judgment and thought content normal.    MAU Course  Procedures (including critical care time)  Labs Reviewed  URINALYSIS, [place] (NOT AT [facility]) - Abnormal; Notable for the following:    Hgb urine dipstick TRACE (*)    Leukocytes, UA MODERATE (*)    All other components within normal limits  URINE MICROSCOPIC-ADD ON - Abnormal; Notable for the following:    Squamous Epithelial / LPF 0-5 (*)    Bacteria, UA FEW (*)    All other components within normal limits   No results found.   No diagnosis found.    MDM  SVE FT/TH/POST/HIGH. FHR pattern reassuring, no uc's. Urine WNL.  withh discharge home.  Attestation of Attending Supervision of Advanced Practitioner (CNM/NP): Evaluation and management procedures were performed by the Advanced Practitioner under my supervision and collaboration. I have reviewed the Advanced Practitioner's note and chart, and I agree with the management and plan.  Guss Bunde., MD 7:12 PM

## 2016-02-23 NOTE — Discharge Instructions (Signed)

## 2016-02-23 NOTE — MAU Note (Addendum)
Having a lot of pelvic and rectal pressure and pelvic pain.  Was having headache yesterday- has gone away today. Was having a lot of ? Braxton hicks, abd was tightening.  Panties are always wet, thinks it is just bladder leakage

## 2016-02-24 ENCOUNTER — Ambulatory Visit (INDEPENDENT_AMBULATORY_CARE_PROVIDER_SITE_OTHER): Payer: Medicaid Other | Admitting: Obstetrics & Gynecology

## 2016-02-24 VITALS — BP 128/74 | HR 87 | Wt 166.2 lb

## 2016-02-24 DIAGNOSIS — Z8759 Personal history of other complications of pregnancy, childbirth and the puerperium: Secondary | ICD-10-CM

## 2016-02-24 DIAGNOSIS — O0993 Supervision of high risk pregnancy, unspecified, third trimester: Secondary | ICD-10-CM

## 2016-02-24 DIAGNOSIS — Z8742 Personal history of other diseases of the female genital tract: Secondary | ICD-10-CM

## 2016-02-24 DIAGNOSIS — O133 Gestational [pregnancy-induced] hypertension without significant proteinuria, third trimester: Secondary | ICD-10-CM

## 2016-02-24 DIAGNOSIS — O09213 Supervision of pregnancy with history of pre-term labor, third trimester: Secondary | ICD-10-CM

## 2016-02-24 NOTE — Progress Notes (Signed)
Subjective:  Kimberly Bennett is a 35 y.o. [id number] at [redacted]w[redacted]d being seen today for ongoing prenatal care.  She is currently monitored for the following issues for this high-risk pregnancy and has Asthma; Advanced maternal age in multigravida; Supervision of high-risk pregnancy; PIH (pregnancy induced hypertension), previous postpartum condition; Maternal varicella, non-immune; History of preterm delivery, currently pregnant; and History of prior pregnancy with IUGR newborn on her problem list.  Patient visited the MAU yesterday (02/23/16) c/o pelvic/rectal pain. She feels like the baby is moving into her pelvis, although MAU providers suggest that baby is not in her pelvis. Patient notes that there has been some relief of the pain since her MAU visit, although some residual pain continues. She states that it "hurts to sit". No history of hemorrhoids. No trauma. No LOF or bleeding per vagina. No dysuria.  Patient reports no bleeding, no contractions, no cramping, no leaking, vaginal irritation and no headaches, edema or RUQ pain.  Contractions: Irregular. Vag. Bleeding: None.  Movement: Present. Denies leaking of fluid.   The following portions of the patient's history were reviewed and updated as appropriate: allergies, current medications, past family history, past medical history, past social history, past surgical history and problem list. Problem list updated.  Objective:   Filed Vitals:   02/24/16 0929  BP: 128/74  Pulse: 87  Weight: 166 lb 3.2 oz (75.388 kg)    Fetal Status: Fetal Heart Rate (bpm): 130   Movement: Present     General:  Alert, oriented and cooperative. Patient is in no acute distress.  Skin: Skin is warm and dry. No rash noted.   Cardiovascular: Normal heart rate noted  Respiratory: Normal respiratory effort, no problems with respiration noted  Abdomen: Soft, gravid, appropriate for gestational age. Pain/Pressure: Present     Pelvic: Cervical exam deferred         Extremities: Normal range of motion.  Edema: Trace  Mental Status: Normal mood and affect. Normal behavior. Normal judgment and thought content.   Urinalysis:      Assessment and Plan:  Pregnancy: [id number] at [redacted]w[redacted]d  1. Supervision of high-risk pregnancy, third trimester  - History of preterm delivery x 1 after PPROM, then term delivery x 3.  - History of PIH; reviewed PIH symptoms, patient denies all - GC/Chlamydia, GBS at next visit.  2. Pelvic pain affecting pregnancy in third trimester, antepartum - Patient educated that pelvic pain as the pregnancy progresses is normal. Wearing a belly band and sitting on an exercise ball encouraged.  Preterm labor symptoms and general obstetric precautions including but not limited to vaginal bleeding, contractions, leaking of fluid and fetal movement were reviewed in detail with the patient. Please refer to After Visit Summary for other counseling recommendations.  Return in about 1 week (around 03/02/2016).   Marya Amsler, Student-PA

## 2016-03-03 ENCOUNTER — Other Ambulatory Visit (HOSPITAL_COMMUNITY)
Admission: RE | Admit: 2016-03-03 | Discharge: 2016-03-03 | Disposition: A | Discharge status: Home / Self Care | Payer: Medicaid Other | Source: Ambulatory Visit | Attending: Advanced Practice Midwife | Admitting: Advanced Practice Midwife

## 2016-03-03 ENCOUNTER — Ambulatory Visit (INDEPENDENT_AMBULATORY_CARE_PROVIDER_SITE_OTHER): Payer: Medicaid Other | Admitting: Advanced Practice Midwife

## 2016-03-03 VITALS — BP 130/82 | HR 80 | Wt 167.5 lb

## 2016-03-03 DIAGNOSIS — O26893 Other specified pregnancy related conditions, third trimester: Secondary | ICD-10-CM | POA: Diagnosis present

## 2016-03-03 DIAGNOSIS — O09523 Supervision of elderly multigravida, third trimester: Secondary | ICD-10-CM

## 2016-03-03 DIAGNOSIS — Z113 Encounter for screening for infections with a predominantly sexual mode of transmission: Secondary | ICD-10-CM | POA: Diagnosis not present

## 2016-03-03 DIAGNOSIS — R102 Pelvic and perineal pain: Secondary | ICD-10-CM | POA: Diagnosis not present

## 2016-03-03 DIAGNOSIS — O133 Gestational [pregnancy-induced] hypertension without significant proteinuria, third trimester: Secondary | ICD-10-CM | POA: Diagnosis not present

## 2016-03-03 DIAGNOSIS — D126 Benign neoplasm of colon, unspecified: Secondary | ICD-10-CM | POA: Diagnosis not present

## 2016-03-03 DIAGNOSIS — O0993 Supervision of high risk pregnancy, unspecified, third trimester: Secondary | ICD-10-CM

## 2016-03-03 NOTE — Progress Notes (Signed)
Subjective:  Kimberly Bennett is a 35 y.o. [id number] at [redacted]w[redacted]d being seen today for ongoing prenatal care.  She is currently monitored for the following issues for this high-risk pregnancy and has Asthma; Advanced maternal age in multigravida; Supervision of high-risk pregnancy; PIH (pregnancy induced hypertension), previous postpartum condition; Maternal varicella, non-immune; History of preterm delivery, currently pregnant; and History of prior pregnancy with IUGR newborn on her problem list.  Patient reports constant pelvic pressure and irregular braxton-hicks contractions.   .  .  Movement: Present. Denies leaking of fluid.   The following portions of the patient's history were reviewed and updated as appropriate: allergies, current medications, past family history, past medical history, past social history, past surgical history and problem list. Problem list updated.  Objective:   Filed Vitals:   03/03/16 0859  BP: 130/82  Pulse: 80  Weight: 167 lb 8 oz (75.978 kg)    Fetal Status: Fetal Heart Rate (bpm): 145 Fundal Height: 37 cm Movement: Present  Presentation: Vertex  General:  Alert, oriented and cooperative. Patient is in no acute distress.  Skin: Skin is warm and dry. No rash noted.   Cardiovascular: Normal heart rate noted  Respiratory: Normal respiratory effort, no problems with respiration noted  Abdomen: Soft, gravid, appropriate for gestational age. Pain/Pressure: Present     Pelvic:  Cervical exam performed Dilation: Fingertip Effacement (%): 50 Station: -3  Extremities: Normal range of motion.     Mental Status: Normal mood and affect. Normal behavior. Normal judgment and thought content.   Urinalysis:      Assessment and Plan:  Pregnancy: [id number] at [redacted]w[redacted]d  1. Pelvic pain affecting pregnancy in third trimester, antepartum --Discussed management of pelvic pain including rest/ice/heat/Tylenol/pregnancy support belt.  Discussed good ergonomics for pubic symphysis pain.    --Fetus in vertex position by Leopolds  Term labor symptoms and general obstetric precautions including but not limited to vaginal bleeding, contractions, leaking of fluid and fetal movement were reviewed in detail with the patient. Please refer to After Visit Summary for other counseling recommendations.  Return in about 1 week (around 03/10/2016).   Elvera Maria, CNM

## 2016-03-04 LAB — GC/CHLAMYDIA PROBE AMP (~~LOC~~) NOT AT ARMC
CHLAMYDIA, DNA PROBE: NEGATIVE
NEISSERIA GONORRHEA: NEGATIVE

## 2016-03-05 LAB — CULTURE, BETA STREP (GROUP B ONLY)

## 2016-03-11 ENCOUNTER — Encounter: Payer: Self-pay | Admitting: Advanced Practice Midwife

## 2016-03-11 ENCOUNTER — Ambulatory Visit (INDEPENDENT_AMBULATORY_CARE_PROVIDER_SITE_OTHER): Payer: Medicaid Other | Admitting: Family

## 2016-03-11 VITALS — BP 144/85 | HR 88 | Wt 166.6 lb

## 2016-03-11 DIAGNOSIS — Z22330 Carrier of Group B streptococcus: Secondary | ICD-10-CM | POA: Diagnosis not present

## 2016-03-11 DIAGNOSIS — O0993 Supervision of high risk pregnancy, unspecified, third trimester: Secondary | ICD-10-CM

## 2016-03-11 DIAGNOSIS — O99820 Streptococcus B carrier state complicating pregnancy: Secondary | ICD-10-CM | POA: Insufficient documentation

## 2016-03-11 DIAGNOSIS — O09523 Supervision of elderly multigravida, third trimester: Secondary | ICD-10-CM

## 2016-03-11 NOTE — Progress Notes (Signed)
Bedside US for presentation - vertex confirmed

## 2016-03-11 NOTE — Progress Notes (Signed)
Subjective:  Kimberly Bennett is a 35 y.o. [id number] at [redacted]w[redacted]d being seen today for ongoing prenatal care.  She is currently monitored for the following issues for this high-risk pregnancy and has Asthma; Advanced maternal age in multigravida; Supervision of high-risk pregnancy; PIH (pregnancy induced hypertension), previous postpartum condition; Maternal varicella, non-immune; History of preterm delivery, currently pregnant; History of prior pregnancy with IUGR newborn; and GBS (group B Streptococcus carrier), +RV culture, currently pregnant on her problem list.  Patient reports occasional contractions.  Contractions: Irregular. Vag. Bleeding: None.  Movement: Present. Denies leaking of fluid.   The following portions of the patient's history were reviewed and updated as appropriate: allergies, current medications, past family history, past medical history, past social history, past surgical history and problem list. Problem list updated.  Objective:   Filed Vitals:   03/11/16 0958  BP: 144/85  Pulse: 88  Weight: 166 lb 9.6 oz (75.569 kg)    Fetal Status: Fetal Heart Rate (bpm): 145 Fundal Height: 38 cm Movement: Present     General:  Alert, oriented and cooperative. Patient is in no acute distress.  Skin: Skin is warm and dry. No rash noted.   Cardiovascular: Normal heart rate noted  Respiratory: Normal respiratory effort, no problems with respiration noted  Abdomen: Soft, gravid, appropriate for gestational age. Pain/Pressure: Present     Pelvic:  Cervical exam performed Dilation: 1 Effacement (%): 50 Station: -3  Extremities: Normal range of motion.  Edema: Trace  Mental Status: Normal mood and affect. Normal behavior. Normal judgment and thought content.   Urinalysis:     Urine results not available at discharge.     Assessment and Plan:  Pregnancy: [id number] at [redacted]w[redacted]d  1. Advanced maternal age in multigravida, third trimester - Panorama nml  2. Supervision of high-risk pregnancy,  third trimester - Confirm presentation with ultrasound > vertex  3. GBS (group B Streptococcus carrier), +RV culture, currently pregnant - Reviewed need for antibiotics in labor  Term labor symptoms and general obstetric precautions including but not limited to vaginal bleeding, contractions, leaking of fluid and fetal movement were reviewed in detail with the patient. Please refer to After Visit Summary for other counseling recommendations.  Return in about 1 week (around 03/18/2016).   Venia Carbon Michiel Cowboy, CNM

## 2016-03-11 NOTE — Progress Notes (Signed)
Patient would like to be check today

## 2016-03-16 ENCOUNTER — Ambulatory Visit (INDEPENDENT_AMBULATORY_CARE_PROVIDER_SITE_OTHER): Payer: Medicaid Other | Admitting: Advanced Practice Midwife

## 2016-03-16 VITALS — BP 124/85 | HR 92 | Wt 169.0 lb

## 2016-03-16 DIAGNOSIS — Z22330 Carrier of Group B streptococcus: Secondary | ICD-10-CM | POA: Diagnosis not present

## 2016-03-16 DIAGNOSIS — Z8759 Personal history of other complications of pregnancy, childbirth and the puerperium: Secondary | ICD-10-CM

## 2016-03-16 DIAGNOSIS — Z8742 Personal history of other diseases of the female genital tract: Secondary | ICD-10-CM | POA: Diagnosis not present

## 2016-03-16 DIAGNOSIS — O99820 Streptococcus B carrier state complicating pregnancy: Secondary | ICD-10-CM

## 2016-03-16 DIAGNOSIS — O09523 Supervision of elderly multigravida, third trimester: Secondary | ICD-10-CM

## 2016-03-16 DIAGNOSIS — O0993 Supervision of high risk pregnancy, unspecified, third trimester: Secondary | ICD-10-CM

## 2016-03-16 LAB — POCT URINALYSIS DIP (DEVICE)
Bilirubin Urine: NEGATIVE
Glucose, UA: NEGATIVE mg/dL
Ketones, ur: NEGATIVE mg/dL
NITRITE: NEGATIVE
PH: 5.5 (ref 5.0–8.0)
Protein, ur: NEGATIVE mg/dL
SPECIFIC GRAVITY, URINE: 1.015 (ref 1.005–1.030)
UROBILINOGEN UA: 1 mg/dL (ref 0.0–1.0)

## 2016-03-16 NOTE — Progress Notes (Signed)
Patient ID: Kimberly Bennett, female   DOB: September 10, 1980, 35 y.o.   MRN: [id number] Subjective:  Kimberly Bennett is a 35 y.o. [id number] at [redacted]w[redacted]d being seen today for ongoing prenatal care.  She is currently monitored for the following issues for this high-risk pregnancy and has Asthma; Advanced maternal age in multigravida; Supervision of high-risk pregnancy; PIH (pregnancy induced hypertension), previous postpartum condition; Maternal varicella, non-immune; History of preterm delivery, currently pregnant; History of prior pregnancy with IUGR newborn; and GBS (group B Streptococcus carrier), +RV culture, currently pregnant on her problem list.  Patient reports occasional contractions.  Contractions: Irregular. Vag. Bleeding: None.  Movement: Present. Denies leaking of fluid.   The following portions of the patient's history were reviewed and updated as appropriate: allergies, current medications, past family history, past medical history, past social history, past surgical history and problem list. Problem list updated.  Objective:   Vitals:   03/16/16 1629  BP: 124/85  Pulse: 92  Weight: 169 lb (76.7 kg)    Fetal Status: Fetal Heart Rate (bpm): 143 Fundal Height: 39 cm Movement: Present  Presentation: Vertex  General:  Alert, oriented and cooperative. Patient is in no acute distress.  Skin: Skin is warm and dry. No rash noted.   Cardiovascular: Normal heart rate noted  Respiratory: Normal respiratory effort, no problems with respiration noted  Abdomen: Soft, gravid, appropriate for gestational age. Pain/Pressure: Present     Pelvic:  Cervical exam performed Dilation: 1 Effacement (%): 50 Station: -3  Extremities: Normal range of motion.  Edema: None  Mental Status: Normal mood and affect. Normal behavior. Normal judgment and thought content.   Urinalysis: Urine Protein: Negative Urine Glucose: Negative  Assessment and Plan:  Pregnancy: [id number] at [redacted]w[redacted]d  Supervision of other high risk  pregnancies  Hx IUGR  High normal growth on last [place]. F?u sa clinically indicated.   Term labor symptoms and general obstetric precautions including but not limited to vaginal bleeding, contractions, leaking of fluid and fetal movement were reviewed in detail with the patient. Please refer to After Visit Summary for other counseling recommendations.  F/u 1 week   Kimberly Bennett, [place]

## 2016-03-16 NOTE — Progress Notes (Signed)
Large leukocytes noted on urinalysis.  

## 2016-03-16 NOTE — Patient Instructions (Addendum)
Group B streptococcus (GBS) is a type of bacteria often found in healthy women. GBS is not the same as the bacteria that causes strep throat. You may have GBS in your vagina, rectum, or bladder. GBS does not spread through sexual contact, but it can be passed to a baby during childbirth. This can be dangerous for your baby. It is not dangerous to you and usually does not cause any symptoms. Your health care provider may test you for GBS when your pregnancy is between 35 and 37 weeks. GBS is dangerous only during birth, so there is no need to test for it earlier. It is possible to have GBS during pregnancy and never pass it to your baby. If your test results are positive for GBS, your health care provider may recommend giving you antibiotic medicine during delivery to make sure your baby stays healthy. RISK FACTORS You are more likely to pass GBS to your baby if:   Your water breaks (ruptured membrane) or you go into labor before 37 weeks.  Your water breaks 18 hours before you deliver.  You passed GBS during a previous pregnancy.  You have a urinary tract infection caused by GBS any time during pregnancy.  You have a fever during labor. SYMPTOMS Most women who have GBS do not have any symptoms. If you have a urinary tract infection caused by GBS, you might have frequent or painful urination and fever. Babies who get GBS usually show symptoms within 7 days of birth. Symptoms may include:   Breathing problems.  Heart and blood pressure problems.  Digestive and kidney problems. DIAGNOSIS Routine screening for GBS is recommended for all pregnant women. A health care provider takes a sample of the fluid in your vagina and rectum with a swab. It is then sent to a lab to be checked for GBS. A sample of your urine may also be checked for the bacteria.  TREATMENT If you test positive for GBS, you may need treatment with an antibiotic medicine during labor. As soon as you go into labor, or as soon as  your membranes rupture, you will get the antibiotic medicine through an IV access. You will continue to get the medicine until after you give birth. You do not need antibiotic medicine if you are having a cesarean delivery.If your baby shows signs or symptoms of GBS after birth, your baby can also be treated with an antibiotic medicine. HOME CARE INSTRUCTIONS   Take all antibiotic medicine as prescribed by your health care provider. Only take medicine as directed.   Continue with prenatal visits and care.   Keep all follow-up appointments.  SEEK MEDICAL CARE IF:   You have pain when you urinate.   You have to urinate frequently.   You have a fever.  SEEK IMMEDIATE MEDICAL CARE IF:   Your membranes rupture.  You go into labor.   This information is not intended to replace advice given to you by your health care provider. Make sure you discuss any questions you have with your health care provider.   Document Released: 11/17/2007 Document Revised: 08/15/2013 Document Reviewed: 06/02/2013 Elsevier Interactive Patient Education 2016 [employer]  

## 2016-03-18 ENCOUNTER — Encounter (HOSPITAL_COMMUNITY): Payer: Self-pay

## 2016-03-18 ENCOUNTER — Inpatient Hospital Stay (HOSPITAL_COMMUNITY)
Admission: AD | Admit: 2016-03-18 | Discharge: 2016-03-19 | Disposition: A | Discharge status: Home / Self Care | Payer: Medicaid Other | Source: Ambulatory Visit | Attending: Obstetrics and Gynecology | Admitting: Obstetrics and Gynecology

## 2016-03-18 DIAGNOSIS — Z8759 Personal history of other complications of pregnancy, childbirth and the puerperium: Secondary | ICD-10-CM

## 2016-03-18 DIAGNOSIS — IMO0001 Reserved for inherently not codable concepts without codable children: Secondary | ICD-10-CM

## 2016-03-18 DIAGNOSIS — O99820 Streptococcus B carrier state complicating pregnancy: Secondary | ICD-10-CM

## 2016-03-18 NOTE — MAU Note (Signed)
Pt reports contractions, pressure and bleeding.

## 2016-03-19 ENCOUNTER — Inpatient Hospital Stay (HOSPITAL_COMMUNITY): Payer: Medicaid Other | Admitting: Anesthesiology

## 2016-03-19 ENCOUNTER — Encounter (HOSPITAL_COMMUNITY): Payer: Self-pay | Admitting: *Deleted

## 2016-03-19 ENCOUNTER — Encounter (HOSPITAL_COMMUNITY)
Admission: AD | Disposition: A | Discharge status: Home / Self Care | Payer: Self-pay | Source: Ambulatory Visit | Attending: Obstetrics and Gynecology

## 2016-03-19 ENCOUNTER — Inpatient Hospital Stay (HOSPITAL_COMMUNITY)
Admission: AD | Admit: 2016-03-19 | Discharge: 2016-03-21 | DRG: 767 | Disposition: A | Discharge status: Home / Self Care | Payer: Medicaid Other | Source: Ambulatory Visit | Attending: Obstetrics and Gynecology | Admitting: Obstetrics and Gynecology

## 2016-03-19 DIAGNOSIS — O99820 Streptococcus B carrier state complicating pregnancy: Secondary | ICD-10-CM

## 2016-03-19 DIAGNOSIS — Z880 Allergy status to penicillin: Secondary | ICD-10-CM

## 2016-03-19 DIAGNOSIS — O0993 Supervision of high risk pregnancy, unspecified, third trimester: Secondary | ICD-10-CM

## 2016-03-19 DIAGNOSIS — IMO0001 Reserved for inherently not codable concepts without codable children: Secondary | ICD-10-CM

## 2016-03-19 DIAGNOSIS — Z811 Family history of alcohol abuse and dependence: Secondary | ICD-10-CM | POA: Diagnosis not present

## 2016-03-19 DIAGNOSIS — Z833 Family history of diabetes mellitus: Secondary | ICD-10-CM | POA: Diagnosis not present

## 2016-03-19 DIAGNOSIS — Z3483 Encounter for supervision of other normal pregnancy, third trimester: Secondary | ICD-10-CM | POA: Diagnosis present

## 2016-03-19 DIAGNOSIS — Z823 Family history of stroke: Secondary | ICD-10-CM

## 2016-03-19 DIAGNOSIS — Z302 Encounter for sterilization: Secondary | ICD-10-CM

## 2016-03-19 DIAGNOSIS — J45909 Unspecified asthma, uncomplicated: Secondary | ICD-10-CM | POA: Diagnosis present

## 2016-03-19 DIAGNOSIS — O09213 Supervision of pregnancy with history of pre-term labor, third trimester: Secondary | ICD-10-CM

## 2016-03-19 DIAGNOSIS — Z3A39 39 weeks gestation of pregnancy: Secondary | ICD-10-CM

## 2016-03-19 DIAGNOSIS — Z8249 Family history of ischemic heart disease and other diseases of the circulatory system: Secondary | ICD-10-CM

## 2016-03-19 DIAGNOSIS — O99824 Streptococcus B carrier state complicating childbirth: Secondary | ICD-10-CM | POA: Diagnosis present

## 2016-03-19 DIAGNOSIS — O9952 Diseases of the respiratory system complicating childbirth: Secondary | ICD-10-CM | POA: Diagnosis present

## 2016-03-19 DIAGNOSIS — O09893 Supervision of other high risk pregnancies, third trimester: Secondary | ICD-10-CM

## 2016-03-19 DIAGNOSIS — Z8759 Personal history of other complications of pregnancy, childbirth and the puerperium: Secondary | ICD-10-CM

## 2016-03-19 DIAGNOSIS — Z825 Family history of asthma and other chronic lower respiratory diseases: Secondary | ICD-10-CM | POA: Diagnosis not present

## 2016-03-19 DIAGNOSIS — O09523 Supervision of elderly multigravida, third trimester: Secondary | ICD-10-CM

## 2016-03-19 HISTORY — PX: TUBAL LIGATION: SHX77

## 2016-03-19 LAB — TYPE AND SCREEN
ABO/RH(D): O POS
Antibody Screen: NEGATIVE

## 2016-03-19 LAB — CBC
HEMATOCRIT: 32.3 % — AB (ref 36.0–46.0)
HEMOGLOBIN: 10 g/dL — AB (ref 12.0–15.0)
MCH: 20.3 pg — ABNORMAL LOW (ref 26.0–34.0)
MCHC: 31 g/dL (ref 30.0–36.0)
MCV: 65.5 fL — AB (ref 78.0–100.0)
Platelets: 359 10*3/uL (ref 150–400)
RBC: 4.93 MIL/uL (ref 3.87–5.11)
RDW: 18 % — AB (ref 11.5–15.5)
WBC: 12.1 10*3/uL — AB (ref 4.0–10.5)

## 2016-03-19 SURGERY — LIGATION, FALLOPIAN TUBE, POSTPARTUM
Anesthesia: Epidural | Site: Abdomen | Laterality: Bilateral

## 2016-03-19 MED ORDER — PROMETHAZINE HCL 25 MG/ML IJ SOLN: 12.5000 mg | Freq: Once | INTRAMUSCULAR | Status: DC | PRN

## 2016-03-19 MED ORDER — FENTANYL 2.5 MCG/ML BUPIVACAINE 1/10 % EPIDURAL INFUSION (WH - ANES)
14.0000 mL/h | INTRAMUSCULAR | Status: DC | PRN
  Administered 2016-03-19: 14 mL/h via EPIDURAL
  Filled 2016-03-19: qty 125

## 2016-03-19 MED ORDER — FLEET ENEMA 7-19 GM/118ML RE ENEM: 1.0000 | ENEMA | RECTAL | Status: DC | PRN

## 2016-03-19 MED ORDER — SODIUM BICARBONATE 8.4 % IV SOLN
INTRAVENOUS | Status: DC | PRN
  Administered 2016-03-19 (×5): 5 mL via EPIDURAL

## 2016-03-19 MED ORDER — FENTANYL CITRATE (PF) 100 MCG/2ML IJ SOLN
INTRAMUSCULAR | Status: DC | PRN
  Administered 2016-03-19 (×2): 50 ug via INTRAVENOUS

## 2016-03-19 MED ORDER — WITCH HAZEL-GLYCERIN EX PADS: 1.0000 "application " | MEDICATED_PAD | CUTANEOUS | Status: DC | PRN

## 2016-03-19 MED ORDER — DEXAMETHASONE SODIUM PHOSPHATE 10 MG/ML IJ SOLN
INTRAMUSCULAR | Status: DC | PRN
  Administered 2016-03-19: 4 mg via INTRAVENOUS

## 2016-03-19 MED ORDER — FAMOTIDINE 20 MG PO TABS
40.0000 mg | ORAL_TABLET | Freq: Once | ORAL | Status: AC
Start: 2016-03-19 — End: 2016-03-19
  Administered 2016-03-19: 40 mg via ORAL
  Filled 2016-03-19: qty 2

## 2016-03-19 MED ORDER — OXYCODONE-ACETAMINOPHEN 5-325 MG PO TABS: 2.0000 | ORAL_TABLET | ORAL | Status: DC | PRN

## 2016-03-19 MED ORDER — LIDOCAINE HCL (PF) 1 % IJ SOLN
INTRAMUSCULAR | Status: DC | PRN
  Administered 2016-03-19: 8 mL via EPIDURAL
  Administered 2016-03-19: 6 mL via EPIDURAL

## 2016-03-19 MED ORDER — DIBUCAINE 1 % RE OINT: 1.0000 "application " | TOPICAL_OINTMENT | RECTAL | Status: DC | PRN

## 2016-03-19 MED ORDER — ONDANSETRON HCL 4 MG/2ML IJ SOLN
INTRAMUSCULAR | Status: AC
  Filled 2016-03-19: qty 2

## 2016-03-19 MED ORDER — FENTANYL CITRATE (PF) 100 MCG/2ML IJ SOLN
50.0000 ug | Freq: Once | INTRAMUSCULAR | Status: AC
  Administered 2016-03-19: 50 ug via INTRAMUSCULAR
  Filled 2016-03-19: qty 2

## 2016-03-19 MED ORDER — BUPIVACAINE HCL (PF) 0.25 % IJ SOLN
INTRAMUSCULAR | Status: DC | PRN
  Administered 2016-03-19: 10 mL

## 2016-03-19 MED ORDER — LIDOCAINE-EPINEPHRINE (PF) 2 %-1:200000 IJ SOLN
INTRAMUSCULAR | Status: AC
  Filled 2016-03-19: qty 20

## 2016-03-19 MED ORDER — METOCLOPRAMIDE HCL 10 MG PO TABS
10.0000 mg | ORAL_TABLET | Freq: Once | ORAL | Status: AC
Start: 2016-03-19 — End: 2016-03-19
  Administered 2016-03-19: 10 mg via ORAL
  Filled 2016-03-19: qty 1

## 2016-03-19 MED ORDER — LACTATED RINGERS IV SOLN
INTRAVENOUS | Status: DC
  Administered 2016-03-19: 11:00:00 via INTRAVENOUS

## 2016-03-19 MED ORDER — IBUPROFEN 600 MG PO TABS
600.0000 mg | ORAL_TABLET | Freq: Four times a day (QID) | ORAL | Status: DC
  Administered 2016-03-20 – 2016-03-21 (×4): 600 mg via ORAL
  Filled 2016-03-19 (×5): qty 1

## 2016-03-19 MED ORDER — MEPERIDINE HCL 25 MG/ML IJ SOLN: 6.2500 mg | INTRAMUSCULAR | Status: DC | PRN

## 2016-03-19 MED ORDER — ONDANSETRON HCL 4 MG PO TABS: 4.0000 mg | ORAL_TABLET | ORAL | Status: DC | PRN

## 2016-03-19 MED ORDER — COCONUT OIL OIL: 1.0000 "application " | TOPICAL_OIL | Status: DC | PRN

## 2016-03-19 MED ORDER — ONDANSETRON HCL 4 MG/2ML IJ SOLN: 4.0000 mg | INTRAMUSCULAR | Status: DC | PRN

## 2016-03-19 MED ORDER — OXYCODONE-ACETAMINOPHEN 5-325 MG PO TABS: 1.0000 | ORAL_TABLET | ORAL | Status: DC | PRN

## 2016-03-19 MED ORDER — LACTATED RINGERS IV SOLN
500.0000 mL | Freq: Once | INTRAVENOUS | Status: AC
  Administered 2016-03-19: 500 mL via INTRAVENOUS

## 2016-03-19 MED ORDER — MIDAZOLAM HCL 2 MG/2ML IJ SOLN
INTRAMUSCULAR | Status: AC
  Filled 2016-03-19: qty 2

## 2016-03-19 MED ORDER — ONDANSETRON HCL 4 MG/2ML IJ SOLN
INTRAMUSCULAR | Status: DC | PRN
  Administered 2016-03-19: 4 mg via INTRAVENOUS

## 2016-03-19 MED ORDER — LACTATED RINGERS IV SOLN: INTRAVENOUS | Status: DC

## 2016-03-19 MED ORDER — DEXAMETHASONE SODIUM PHOSPHATE 4 MG/ML IJ SOLN
INTRAMUSCULAR | Status: AC
Start: 2016-03-19 — End: 2016-03-19
  Filled 2016-03-19: qty 1

## 2016-03-19 MED ORDER — SIMETHICONE 80 MG PO CHEW: 80.0000 mg | CHEWABLE_TABLET | ORAL | Status: DC | PRN

## 2016-03-19 MED ORDER — BUPIVACAINE HCL (PF) 0.25 % IJ SOLN
INTRAMUSCULAR | Status: AC
  Filled 2016-03-19: qty 30

## 2016-03-19 MED ORDER — PHENYLEPHRINE 40 MCG/ML (10ML) SYRINGE FOR IV PUSH (FOR BLOOD PRESSURE SUPPORT)
80.0000 ug | PREFILLED_SYRINGE | INTRAVENOUS | Status: DC | PRN
  Filled 2016-03-19: qty 10
  Filled 2016-03-19: qty 5

## 2016-03-19 MED ORDER — FAMOTIDINE 20 MG PO TABS: 40.0000 mg | ORAL_TABLET | Freq: Once | ORAL | Status: DC

## 2016-03-19 MED ORDER — CEFAZOLIN IN D5W 1 GM/50ML IV SOLN
1.0000 g | Freq: Three times a day (TID) | INTRAVENOUS | Status: DC
  Filled 2016-03-19: qty 50

## 2016-03-19 MED ORDER — LIDOCAINE HCL (PF) 1 % IJ SOLN
30.0000 mL | INTRAMUSCULAR | Status: DC | PRN
  Filled 2016-03-19: qty 30

## 2016-03-19 MED ORDER — DIPHENHYDRAMINE HCL 50 MG/ML IJ SOLN: 12.5000 mg | INTRAMUSCULAR | Status: DC | PRN

## 2016-03-19 MED ORDER — ONDANSETRON HCL 4 MG/2ML IJ SOLN: 4.0000 mg | Freq: Four times a day (QID) | INTRAMUSCULAR | Status: DC | PRN

## 2016-03-19 MED ORDER — METOCLOPRAMIDE HCL 10 MG PO TABS: 10.0000 mg | ORAL_TABLET | Freq: Once | ORAL | Status: DC

## 2016-03-19 MED ORDER — OXYTOCIN BOLUS FROM INFUSION
500.0000 mL | Freq: Once | INTRAVENOUS | Status: AC
  Administered 2016-03-19: 500 mL via INTRAVENOUS

## 2016-03-19 MED ORDER — FENTANYL CITRATE (PF) 100 MCG/2ML IJ SOLN
INTRAMUSCULAR | Status: AC
  Filled 2016-03-19: qty 2

## 2016-03-19 MED ORDER — MIDAZOLAM HCL 2 MG/2ML IJ SOLN
INTRAMUSCULAR | Status: DC | PRN
  Administered 2016-03-19 (×2): 1 mg via INTRAVENOUS

## 2016-03-19 MED ORDER — PRENATAL MULTIVITAMIN CH
1.0000 | ORAL_TABLET | Freq: Every day | ORAL | Status: DC
  Administered 2016-03-20 – 2016-03-21 (×2): 1 via ORAL
  Filled 2016-03-19 (×2): qty 1

## 2016-03-19 MED ORDER — SENNOSIDES-DOCUSATE SODIUM 8.6-50 MG PO TABS
2.0000 | ORAL_TABLET | ORAL | Status: DC
  Administered 2016-03-19 – 2016-03-20 (×2): 2 via ORAL
  Filled 2016-03-19 (×2): qty 2

## 2016-03-19 MED ORDER — OXYTOCIN 40 UNITS IN LACTATED RINGERS INFUSION - SIMPLE MED
2.5000 [IU]/h | INTRAVENOUS | Status: DC
  Filled 2016-03-19: qty 1000

## 2016-03-19 MED ORDER — EPHEDRINE 5 MG/ML INJ
10.0000 mg | INTRAVENOUS | Status: DC | PRN
  Filled 2016-03-19: qty 4

## 2016-03-19 MED ORDER — ACETAMINOPHEN 325 MG PO TABS
650.0000 mg | ORAL_TABLET | ORAL | Status: DC | PRN
  Administered 2016-03-19 – 2016-03-20 (×3): 650 mg via ORAL
  Filled 2016-03-19 (×4): qty 2

## 2016-03-19 MED ORDER — CEFAZOLIN SODIUM-DEXTROSE 2-4 GM/100ML-% IV SOLN
2.0000 g | Freq: Once | INTRAVENOUS | Status: AC
  Administered 2016-03-19: 2 g via INTRAVENOUS
  Filled 2016-03-19: qty 100

## 2016-03-19 MED ORDER — BENZOCAINE-MENTHOL 20-0.5 % EX AERO: 1.0000 "application " | INHALATION_SPRAY | CUTANEOUS | Status: DC | PRN

## 2016-03-19 MED ORDER — PHENYLEPHRINE 40 MCG/ML (10ML) SYRINGE FOR IV PUSH (FOR BLOOD PRESSURE SUPPORT)
80.0000 ug | PREFILLED_SYRINGE | INTRAVENOUS | Status: DC | PRN
  Filled 2016-03-19: qty 5

## 2016-03-19 MED ORDER — SOD CITRATE-CITRIC ACID 500-334 MG/5ML PO SOLN: 30.0000 mL | ORAL | Status: DC | PRN

## 2016-03-19 MED ORDER — HYDROMORPHONE HCL 1 MG/ML IJ SOLN: 0.2500 mg | INTRAMUSCULAR | Status: DC | PRN

## 2016-03-19 MED ORDER — CLINDAMYCIN PHOSPHATE 900 MG/50ML IV SOLN
900.0000 mg | Freq: Once | INTRAVENOUS | Status: DC
  Filled 2016-03-19: qty 50

## 2016-03-19 MED ORDER — TETANUS-DIPHTH-ACELL PERTUSSIS 5-2.5-18.5 LF-MCG/0.5 IM SUSP: 0.5000 mL | Freq: Once | INTRAMUSCULAR | Status: DC

## 2016-03-19 MED ORDER — DIPHENHYDRAMINE HCL 25 MG PO CAPS: 25.0000 mg | ORAL_CAPSULE | Freq: Four times a day (QID) | ORAL | Status: DC | PRN

## 2016-03-19 MED ORDER — ZOLPIDEM TARTRATE 5 MG PO TABS: 5.0000 mg | ORAL_TABLET | Freq: Every evening | ORAL | Status: DC | PRN

## 2016-03-19 MED ORDER — LACTATED RINGERS IV SOLN
500.0000 mL | INTRAVENOUS | Status: DC | PRN
Start: 2016-03-19 — End: 2016-03-19

## 2016-03-19 MED ORDER — HYDROCODONE-ACETAMINOPHEN 7.5-325 MG PO TABS: 1.0000 | ORAL_TABLET | Freq: Once | ORAL | Status: DC | PRN

## 2016-03-19 MED ORDER — LACTATED RINGERS IV SOLN
INTRAVENOUS | Status: DC
  Administered 2016-03-19 (×2): via INTRAVENOUS

## 2016-03-19 MED ORDER — ACETAMINOPHEN 325 MG PO TABS: 650.0000 mg | ORAL_TABLET | ORAL | Status: DC | PRN

## 2016-03-19 SURGICAL SUPPLY — 23 items
CONTAINER PREFILL 10% NBF 15ML (MISCELLANEOUS) ×4 IMPLANT
DRSG OPSITE POSTOP 3X4 (GAUZE/BANDAGES/DRESSINGS) ×2 IMPLANT
DURAPREP 26ML APPLICATOR (WOUND CARE) ×2 IMPLANT
GLOVE BIOGEL PI IND STRL 6.5 (GLOVE) ×1 IMPLANT
GLOVE BIOGEL PI IND STRL 7.0 (GLOVE) ×1 IMPLANT
GLOVE BIOGEL PI INDICATOR 6.5 (GLOVE) ×1
GLOVE BIOGEL PI INDICATOR 7.0 (GLOVE) ×1
GLOVE SURG SS PI 6.0 STRL IVOR (GLOVE) ×2 IMPLANT
GOWN STRL REUS W/TWL LRG LVL3 (GOWN DISPOSABLE) ×5 IMPLANT
NEEDLE HYPO 22GX1.5 SAFETY (NEEDLE) ×1 IMPLANT
NS IRRIG 1000ML POUR BTL (IV SOLUTION) ×2 IMPLANT
PACK ABDOMINAL MINOR (CUSTOM PROCEDURE TRAY) ×2 IMPLANT
PROTECTOR NERVE ULNAR (MISCELLANEOUS) ×4 IMPLANT
SPONGE GAUZE 2X2 8PLY STRL LF (GAUZE/BANDAGES/DRESSINGS) ×2 IMPLANT
SPONGE LAP 4X18 X RAY DECT (DISPOSABLE) ×1 IMPLANT
SUT PLAIN 0 NONE (SUTURE) ×2 IMPLANT
SUT VIC AB 0 CT1 27 (SUTURE) ×2
SUT VIC AB 0 CT1 27XBRD ANBCTR (SUTURE) ×1 IMPLANT
SUT VIC AB 3-0 PS2 18 (SUTURE) ×2 IMPLANT
SYR CONTROL 10ML LL (SYRINGE) ×2 IMPLANT
TOWEL OR 17X24 6PK STRL BLUE (TOWEL DISPOSABLE) ×4 IMPLANT
TRAY FOLEY CATH SILVER 14FR (SET/KITS/TRAYS/PACK) ×2 IMPLANT
WATER STERILE IRR 1000ML POUR (IV SOLUTION) ×2 IMPLANT

## 2016-03-19 NOTE — Anesthesia Procedure Notes (Signed)
Epidural Patient location during procedure: OB Start time: 03/19/2016 11:44 AM End time: 03/19/2016 11:48 AM  Staffing Anesthesiologist: Lyn Hollingshead Performed: anesthesiologist   Preanesthetic Checklist Completed: patient identified, surgical consent, pre-op evaluation, timeout performed, IV checked, risks and benefits discussed and monitors and equipment checked  Epidural Patient position: sitting Prep: site prepped and draped and DuraPrep Patient monitoring: continuous pulse ox and blood pressure Approach: midline Location: L3-L4 Injection technique: LOR air  Needle:  Needle type: Tuohy  Needle gauge: 17 G Needle length: 9 cm and 9 Needle insertion depth: 6 cm Catheter type: closed end flexible Catheter size: 19 Gauge Catheter at skin depth: 11 cm Test dose: negative and Other  Assessment Sensory level: T9 Events: blood not aspirated, injection not painful, no injection resistance, negative IV test and no paresthesia  Additional Notes Reason for block:procedure for pain

## 2016-03-19 NOTE — MAU Note (Signed)
Notified provider that a patient is here for a labor eval. Patient is 3/70/-3. No problems during her pregnancy. GBS -. Provider said to recheck patient in an hour.

## 2016-03-19 NOTE — Anesthesia Postprocedure Evaluation (Signed)
Anesthesia Post Note  Patient: Kimberly Bennett  Procedure(s) Performed: Procedure(s) (LRB): POST PARTUM TUBAL LIGATION (Bilateral)  Patient location during evaluation: PACU Anesthesia Type: Epidural Level of consciousness: oriented and awake and alert Pain management: pain level controlled Vital Signs Assessment: post-procedure vital signs reviewed and stable Respiratory status: spontaneous breathing, respiratory function stable and nonlabored ventilation Cardiovascular status: blood pressure returned to baseline and stable Postop Assessment: no headache, no backache, epidural receding, patient able to bend at knees and no signs of nausea or vomiting Anesthetic complications: no     Last Vitals:  Vitals:   03/19/16 2000 03/19/16 2015  BP: 110/67 (P) 110/65  Pulse:    Resp:    Temp:      Last Pain:  Vitals:   03/19/16 1945  TempSrc:   PainSc: 0-No pain   Pain Goal: Patients Stated Pain Goal: 3 (03/19/16 1945)               Ying Blankenhorn A.

## 2016-03-19 NOTE — Progress Notes (Signed)
35 y.o. yo [phone]  with undesired fertility,status post vaginal delivery, desires permanent sterilization. Risks and benefits of procedure discussed with patient including permanence of method, bleeding, infection, injury to surrounding organs and need for additional procedures. Risk failure of 0.5-1% with increased risk of ectopic gestation if pregnancy occurs was also discussed with patient. All questions were answered. Consent signed.

## 2016-03-19 NOTE — Op Note (Signed)
PREOPERATIVE DIAGNOSIS:  Undesired fertility  POSTOPERATIVE DIAGNOSIS:  Undesired fertility  PROCEDURE:  Postpartum Bilateral Tubal Sterilization using Pomeroy method   ANESTHESIA:  Epidural  COMPLICATIONS:  None immediate.  ESTIMATED BLOOD LOSS:  Less than 20cc.  FLUIDS: 800 cc LR.  URINE OUTPUT:  50 cc of clear urine.  INDICATIONS: 35 y.o. yo [id number]  with undesired fertility,status post vaginal delivery, desires permanent sterilization. Risks and benefits of procedure discussed with patient including permanence of method, bleeding, infection, injury to surrounding organs and need for additional procedures. Risk failure of 0.5-1% with increased risk of ectopic gestation if pregnancy occurs was also discussed with patient.   FINDINGS:  Normal uterus, tubes, and ovaries.  TECHNIQUE: After informed consent was obtained, the patient was taken to the operating room where anesthesia was induced and found to be adequate. A small transverse, infraumbilical skin incision was made with the scalpel. This incision was carried down to the underlying layer of fascia. The fascia was grasped with Kocher clamps tented up and entered sharply with Mayo scissors. Underlying peritoneum was then identified tented up and entered sharply with Metzenbaum scissors. The fascia was tagged with 0 Vicryl. The patient's left fallopian tube was then identified, brought to the incision, and grasped with a Babcock clamp. The tube was then followed out to the fimbria. The Babcock clamp was then used to grasp the tube approximately 4 cm from the cornual region. A 3 cm segment of the tube was then ligated with free tie of plain gut suture, transected and excised. Good hemostasis was noted and the tube was returned to the abdomen. The right fallopian tube was then identified to its fimbriated end, ligated, and a 3 cm segment excised in a similar fashion. Excellent hemostasis was noted, and the tube returned to the abdomen. The  fascia was re-approximated with 0 Vicryl. The skin was closed in a subcuticular fashion with 3-0 Vicryl. Quarter percent Marcaine solution was then injected at the incision site. The patient tolerated the procedure well. Sponge, lap, and needle count were correct x2. The patient was taken to recovery room in stable condition.

## 2016-03-19 NOTE — Transfer of Care (Signed)
Immediate Anesthesia Transfer of Care Note  Patient: Kimberly Bennett  Procedure(s) Performed: Procedure(s): POST PARTUM TUBAL LIGATION (Bilateral)  Patient Location: PACU  Anesthesia Type:Epidural  Level of Consciousness: sedated  Airway & Oxygen Therapy: Patient Spontanous Breathing  Post-op Assessment: Report given to RN  Post vital signs: Reviewed and stable  Last Vitals:  Vitals:   03/19/16 1805 03/19/16 1844  BP: 138/86 127/81  Pulse: 91 94  Resp: 20 18  Temp:  37.2 C    Last Pain:  Vitals:   03/19/16 1844  TempSrc: Oral  PainSc: 0-No pain      Patients Stated Pain Goal: 3 ([id number])  Complications: No apparent anesthesia complications

## 2016-03-19 NOTE — Anesthesia Preprocedure Evaluation (Signed)
Anesthesia Evaluation  Patient identified by MRN, date of birth, ID band Patient awake    Reviewed: Allergy & Precautions, H&P , Patient's Chart, lab work & pertinent test results  Airway Mallampati: I  TM Distance: >3 FB Neck ROM: full    Dental no notable dental hx. (+) Teeth Intact   Pulmonary    Pulmonary exam normal breath sounds clear to auscultation       Cardiovascular negative cardio ROS Normal cardiovascular exam Rhythm:Regular Rate:Normal     Neuro/Psych negative neurological ROS  negative psych ROS   GI/Hepatic negative GI ROS, Neg liver ROS,   Endo/Other  negative endocrine ROS  Renal/GU negative Renal ROS     Musculoskeletal   Abdominal Normal abdominal exam  (+)   Peds  Hematology negative hematology ROS (+)   Anesthesia Other Findings   Reproductive/Obstetrics negative OB ROS Desires sterilization post partum                             Anesthesia Physical  Anesthesia Plan  ASA: II  Anesthesia Plan: Epidural   Post-op Pain Management:    Induction:   Airway Management Planned:   Additional Equipment:   Intra-op Plan:   Post-operative Plan:   Informed Consent: I have reviewed the patients History and Physical, chart, labs and discussed the procedure including the risks, benefits and alternatives for the proposed anesthesia with the patient or authorized representative who has indicated his/her understanding and acceptance.     Plan Discussed with:   Anesthesia Plan Comments:         Anesthesia Quick Evaluation

## 2016-03-19 NOTE — H&P (Signed)
LABOR AND DELIVERY ADMISSION HISTORY AND PHYSICAL NOTE  Kimberly Bennett is a 35 y.o. female [phone] with IUP at [redacted]w[redacted]d by 6 week [place] presenting for SOL. Came into maternity admissions the night before at 12 am and was found to be 3/70/-3 on exam, and was discharged home. Returned at 7 am this morning with more intense contractions. Was found to be 5/100/-2 and was admitted to L&D.  She reports positive fetal movement. She denies leakage of fluid or vaginal bleeding.  Prenatal History/Complications:  Past Medical History: Past Medical History:  Diagnosis Date  . Abnormal Pap smear of cervix    colposcopy 2012, Jan  . Chlamydia   . Pregnancy induced hypertension   . Trichomonas   . Urinary tract infection     Past Surgical History: Past Surgical History:  Procedure Laterality Date  . COLPOSCOPY    . DILATION AND CURETTAGE OF UTERUS      Obstetrical History: OB History    Gravida Para Term Preterm AB Living   8 5 4 1 3 5    SAB TAB Ectopic Multiple Live Births   2 1 0 0 1      Social History: Social History   Social History  . Marital status: Single    Spouse name: N/A  . Number of children: N/A  . Years of education: N/A   Social History Main Topics  . Smoking status: Never Smoker  . Smokeless tobacco: Never Used  . Alcohol use No  . Drug use: No  . Sexual activity: Yes    Birth control/ protection: Condom   Other Topics Concern  . None   Social History Narrative  . None    Family History: Family History  Problem Relation Age of Onset  . Hypertension Mother   . Hypertension Maternal Grandmother   . Diabetes Maternal Grandmother   . Asthma Daughter   . Asthma Son   . Alcohol abuse Father   . Diabetes Maternal Aunt   . Stroke Maternal Aunt   . Heart disease Maternal Aunt     Allergies: Allergies  Allergen Reactions  . Penicillins Hives, Itching and Other (See Comments)    ABLE TO TAKE CEPHALOSPORINS Has patient had a PCN reaction causing  immediate rash, facial/tongue/throat swelling, SOB or lightheadedness with hypotension: No Has patient had a PCN reaction causing severe rash involving mucus membranes or skin necrosis: No Has patient had a PCN reaction that required hospitalization No Has patient had a PCN reaction occurring within the last 10 years: No If all of the above answers are "NO", then may proceed with Cephalosporin use.   . Latex Rash    No prescriptions prior to admission.     Review of Systems   All systems reviewed and negative except as stated in HPI   Blood pressure 124/85, pulse 99, temperature 98.8 F (37.1 C), temperature source Oral, resp. rate 16, height 5\' 5"  (1.651 m), weight 169 lb (76.7 kg), last menstrual period 06/20/2015, SpO2 99 %, unknown if currently breastfeeding. General appearance: alert, cooperative and mild distress Lungs: normal Bennett of breathing  Heart: regular rate and rhythm Abdomen: soft, non-tender, gravid Extremities: No calf swelling or tenderness Presentation: cephalic Fetal monitoring: 120 bpm, 15x15 accels, moderate variability Uterine activity: moderate contractions 6-10 min apart, 60-110 seconds duration  Dilation: 10 Effacement (%): 100 Station: +3 Exam by:: Fatima Blank, CNM   Prenatal labs: ABO, Rh: --/--/O POS (07/27 1030) Antibody: NEG (07/27 1030) Rubella: immune   RPR:  NON REAC (05/16 1111)  HBsAg: Negative (01/23 0000)  HIV: NONREACTIVE (05/16 1111)  GBS:  Positive 1 hr Glucola: 120, 3rd trimester 131 Genetic screening:  NIPS negative  Anatomy US: normal  Prenatal Transfer Tool  Maternal Diabetes: No Genetic Screening: Normal Maternal Ultrasounds/Referrals: Normal Fetal Ultrasounds or other Referrals:  None Maternal Substance Abuse:  No Significant Maternal Medications:  None Significant Maternal Lab Results: None  Results for orders placed or performed during the hospital encounter of 03/19/16 (from the past 24 hour(s))  CBC    Collection Time: 03/19/16 10:30 AM  Result Value Ref Range   WBC 12.1 (H) 4.0 - 10.5 K/uL   RBC 4.93 3.87 - 5.11 MIL/uL   Hemoglobin 10.0 (L) 12.0 - 15.0 g/dL   HCT 32.3 (L) 36.0 - 46.0 %   MCV 65.5 (L) 78.0 - 100.0 fL   MCH 20.3 (L) 26.0 - 34.0 pg   MCHC 31.0 30.0 - 36.0 g/dL   RDW 18.0 (H) 11.5 - 15.5 %   Platelets 359 150 - 400 K/uL  Type and screen [facility]   Collection Time: 03/19/16 10:30 AM  Result Value Ref Range   ABO/RH(D) O POS    Antibody Screen NEG    Sample Expiration 03/22/2016     Patient Active Problem List   Diagnosis Date Noted  . Active labor 03/19/2016  . NSVD (normal spontaneous vaginal delivery) 03/19/2016  . GBS (group B Streptococcus carrier), +RV culture, currently pregnant 03/11/2016  . History of preterm delivery, currently pregnant 12/17/2015  . History of prior pregnancy with IUGR newborn 12/17/2015  . Supervision of high-risk pregnancy 10/25/2015  . PIH (pregnancy induced hypertension), previous postpartum condition 10/25/2015  . Maternal varicella, non-immune 10/25/2015  . Advanced maternal age in multigravida 10/01/2015  . Asthma 10/27/2011    Assessment: Kimberly Bennett is a 35 y.o. [id number] at [redacted]w[redacted]d here for management of SOL.   #Labor: expectant management #Pain: epidural #FWB: Cat 1, reassuring #ID: GBS positive, penicillin allergy; receiving Ancef (no sensitivities and pt tolerates cephalosporins) #MOF: breast and bottle  #MOC: BTL , signed 02/07/16 #[facility] (outpatient)  Casimer Leek, MD, PGY-1, MPH 03/19/2016, 4:55 PM  Midwife attestation: I have seen and examined this patient; I agree with above documentation in the resident's note.   Kimberly Bennett is a 35 y.o. [id number] here for SOL, GBS positive.  PE: BP (P) 110/65 (BP Location: Right Arm)   Pulse (!) 105   Temp 98.7 F (37.1 C)   Resp 16   Ht 5\' 5"  (1.651 m)   Wt 76.7 kg (169 lb)   LMP 06/20/2015   SpO2 92%   Breastfeeding? Unknown    BMI 28.12 kg/m  Gen: calm comfortable, NAD Resp: normal effort, no distress Abd: gravid  ROS, labs, PMH reviewed  Plan: Admit to LD Labor: Expectant management  ID: GBS Ancef, confirmed with pt and with chart that cephalosporins are well tolerated.  [name], [name], CNM  03/19/2016, 9:59 PM

## 2016-03-19 NOTE — Anesthesia Preprocedure Evaluation (Signed)
Anesthesia Evaluation  Patient identified by MRN, date of birth, ID band Patient awake    Reviewed: Allergy & Precautions, H&P , Patient's Chart, lab work & pertinent test results  Airway Mallampati: I  TM Distance: >3 FB Neck ROM: full    Dental no notable dental hx.    Pulmonary    Pulmonary exam normal        Cardiovascular negative cardio ROS Normal cardiovascular exam     Neuro/Psych negative neurological ROS  negative psych ROS   GI/Hepatic negative GI ROS, Neg liver ROS,   Endo/Other  negative endocrine ROS  Renal/GU negative Renal ROS     Musculoskeletal   Abdominal Normal abdominal exam  (+)   Peds  Hematology negative hematology ROS (+)   Anesthesia Other Findings   Reproductive/Obstetrics (+) Pregnancy                             Anesthesia Physical Anesthesia Plan  ASA: II  Anesthesia Plan: Epidural   Post-op Pain Management:    Induction:   Airway Management Planned:   Additional Equipment:   Intra-op Plan:   Post-operative Plan:   Informed Consent: I have reviewed the patients History and Physical, chart, labs and discussed the procedure including the risks, benefits and alternatives for the proposed anesthesia with the patient or authorized representative who has indicated his/her understanding and acceptance.     Plan Discussed with:   Anesthesia Plan Comments:         Anesthesia Quick Evaluation

## 2016-03-19 NOTE — Discharge Instructions (Signed)
Fetal Movement Counts °Patient Name: __________________________________________________ Patient Due Date: ____________________ °Performing a fetal movement count is highly recommended in high-risk pregnancies, but it is good for every pregnant woman to do. Your health care provider may ask you to start counting fetal movements at 28 weeks of the pregnancy. Fetal movements often increase: °· After eating a full meal. °· After physical activity. °· After eating or drinking something sweet or cold. °· At rest. °Pay attention to when you feel the baby is most active. This will help you notice a pattern of your baby's sleep and wake cycles and what factors contribute to an increase in fetal movement. It is important to perform a fetal movement count at the same time each day when your baby is normally most active.  °HOW TO COUNT FETAL MOVEMENTS °1. Find a quiet and comfortable area to sit or lie down on your left side. Lying on your left side provides the best blood and oxygen circulation to your baby. °2. Write down the day and time on a sheet of paper or in a journal. °3. Start counting kicks, flutters, swishes, rolls, or jabs in a 2-hour period. You should feel at least 10 movements within 2 hours. °4. If you do not feel 10 movements in 2 hours, wait 2-3 hours and count again. Look for a change in the pattern or not enough counts in 2 hours. °SEEK MEDICAL CARE IF: °· You feel less than 10 counts in 2 hours, tried twice. °· There is no movement in over an hour. °· The pattern is changing or taking longer each day to reach 10 counts in 2 hours. °· You feel the baby is not moving as he or she usually does. °Date: ____________ Movements: ____________ Start time: ____________ Finish time: ____________  °Date: ____________ Movements: ____________ Start time: ____________ Finish time: ____________ °Date: ____________ Movements: ____________ Start time: ____________ Finish time: ____________ °Date: ____________ Movements:  ____________ Start time: ____________ Finish time: ____________ °Date: ____________ Movements: ____________ Start time: ____________ Finish time: ____________ °Date: ____________ Movements: ____________ Start time: ____________ Finish time: ____________ °Date: ____________ Movements: ____________ Start time: ____________ Finish time: ____________ °Date: ____________ Movements: ____________ Start time: ____________ Finish time: ____________  °Date: ____________ Movements: ____________ Start time: ____________ Finish time: ____________ °Date: ____________ Movements: ____________ Start time: ____________ Finish time: ____________ °Date: ____________ Movements: ____________ Start time: ____________ Finish time: ____________ °Date: ____________ Movements: ____________ Start time: ____________ Finish time: ____________ °Date: ____________ Movements: ____________ Start time: ____________ Finish time: ____________ °Date: ____________ Movements: ____________ Start time: ____________ Finish time: ____________ °Date: ____________ Movements: ____________ Start time: ____________ Finish time: ____________  °Date: ____________ Movements: ____________ Start time: ____________ Finish time: ____________ °Date: ____________ Movements: ____________ Start time: ____________ Finish time: ____________ °Date: ____________ Movements: ____________ Start time: ____________ Finish time: ____________ °Date: ____________ Movements: ____________ Start time: ____________ Finish time: ____________ °Date: ____________ Movements: ____________ Start time: ____________ Finish time: ____________ °Date: ____________ Movements: ____________ Start time: ____________ Finish time: ____________ °Date: ____________ Movements: ____________ Start time: ____________ Finish time: ____________  °Date: ____________ Movements: ____________ Start time: ____________ Finish time: ____________ °Date: ____________ Movements: ____________ Start time: ____________ Finish  time: ____________ °Date: ____________ Movements: ____________ Start time: ____________ Finish time: ____________ °Date: ____________ Movements: ____________ Start time: ____________ Finish time: ____________ °Date: ____________ Movements: ____________ Start time: ____________ Finish time: ____________ °Date: ____________ Movements: ____________ Start time: ____________ Finish time: ____________ °Date: ____________ Movements: ____________ Start time: ____________ Finish time: ____________  °Date: ____________ Movements: ____________ Start time: ____________ Finish   time: ____________ °Date: ____________ Movements: ____________ Start time: ____________ Finish time: ____________ °Date: ____________ Movements: ____________ Start time: ____________ Finish time: ____________ °Date: ____________ Movements: ____________ Start time: ____________ Finish time: ____________ °Date: ____________ Movements: ____________ Start time: ____________ Finish time: ____________ °Date: ____________ Movements: ____________ Start time: ____________ Finish time: ____________ °Date: ____________ Movements: ____________ Start time: ____________ Finish time: ____________  °Date: ____________ Movements: ____________ Start time: ____________ Finish time: ____________ °Date: ____________ Movements: ____________ Start time: ____________ Finish time: ____________ °Date: ____________ Movements: ____________ Start time: ____________ Finish time: ____________ °Date: ____________ Movements: ____________ Start time: ____________ Finish time: ____________ °Date: ____________ Movements: ____________ Start time: ____________ Finish time: ____________ °Date: ____________ Movements: ____________ Start time: ____________ Finish time: ____________ °Date: ____________ Movements: ____________ Start time: ____________ Finish time: ____________  °Date: ____________ Movements: ____________ Start time: ____________ Finish time: ____________ °Date: ____________  Movements: ____________ Start time: ____________ Finish time: ____________ °Date: ____________ Movements: ____________ Start time: ____________ Finish time: ____________ °Date: ____________ Movements: ____________ Start time: ____________ Finish time: ____________ °Date: ____________ Movements: ____________ Start time: ____________ Finish time: ____________ °Date: ____________ Movements: ____________ Start time: ____________ Finish time: ____________ °Date: ____________ Movements: ____________ Start time: ____________ Finish time: ____________  °Date: ____________ Movements: ____________ Start time: ____________ Finish time: ____________ °Date: ____________ Movements: ____________ Start time: ____________ Finish time: ____________ °Date: ____________ Movements: ____________ Start time: ____________ Finish time: ____________ °Date: ____________ Movements: ____________ Start time: ____________ Finish time: ____________ °Date: ____________ Movements: ____________ Start time: ____________ Finish time: ____________ °Date: ____________ Movements: ____________ Start time: ____________ Finish time: ____________ °  °This information is not intended to replace advice given to you by your health care provider. Make sure you discuss any questions you have with your health care provider. °  °Document Released: 09/09/2006 Document Revised: 08/31/2014 Document Reviewed: 06/06/2012 °Elsevier Interactive Patient Education ©2016 [employer] °Braxton Hicks Contractions °Contractions of the uterus can occur throughout pregnancy. Contractions are not always a sign that you are in labor.  °WHAT ARE BRAXTON HICKS CONTRACTIONS?  °Contractions that occur before labor are called Braxton Hicks contractions, or false labor. Toward the end of pregnancy (32-34 weeks), these contractions can develop more often and may become more forceful. This is not true labor because these contractions do not result in opening (dilatation) and thinning of  the cervix. They are sometimes difficult to tell apart from true labor because these contractions can be forceful and people have different pain tolerances. You should not feel embarrassed if you go to the hospital with false labor. Sometimes, the only way to tell if you are in true labor is for your health care provider to look for changes in the cervix. °If there are no prenatal problems or other health problems associated with the pregnancy, it is completely safe to be sent home with false labor and await the onset of true labor. °HOW CAN YOU TELL THE DIFFERENCE BETWEEN TRUE AND FALSE LABOR? °False Labor °· The contractions of false labor are usually shorter and not as hard as those of true labor.   °· The contractions are usually irregular.   °· The contractions are often felt in the front of the lower abdomen and in the groin.   °· The contractions may go away when you walk around or change positions while lying down.   °· The contractions get weaker and are shorter lasting as time goes on.   °· The contractions do not usually become progressively stronger, regular, and closer together as with true labor.   °True Labor °· Contractions in true   labor last 30-70 seconds, become very regular, usually become more intense, and increase in frequency.   The contractions do not go away with walking.   The discomfort is usually felt in the top of the uterus and spreads to the lower abdomen and low back.   True labor can be determined by your health care provider with an exam. This will show that the cervix is dilating and getting thinner.  WHAT TO REMEMBER  Keep up with your usual exercises and follow other instructions given by your health care provider.   Take medicines as directed by your health care provider.   Keep your regular prenatal appointments.   Eat and drink lightly if you think you are going into labor.   If Braxton Hicks contractions are making you uncomfortable:   Change your  position from lying down or resting to walking, or from walking to resting.   Sit and rest in a tub of warm water.   Drink 2-3 glasses of water. Dehydration may cause these contractions.   Do slow and deep breathing several times an hour.  WHEN SHOULD I SEEK IMMEDIATE MEDICAL CARE? Seek immediate medical care if:  Your contractions become stronger, more regular, and closer together.   You have fluid leaking or gushing from your vagina.   You have a fever.   You pass blood-tinged mucus.   You have vaginal bleeding.   You have continuous abdominal pain.   You have low back pain that you never had before.   You feel your baby's head pushing down and causing pelvic pressure.   Your baby is not moving as much as it used to.    This information is not intended to replace advice given to you by your health care provider. Make sure you discuss any questions you have with your health care provider.   Document Released: 08/10/2005 Document Revised: 08/15/2013 Document Reviewed: 05/22/2013 Elsevier Interactive Patient Education [employer].

## 2016-03-19 NOTE — MAU Note (Signed)
Notified provider that patient is unchanged. Provider said to discharge patient.

## 2016-03-19 NOTE — MAU Note (Signed)
Pt returns with worsening contractions.

## 2016-03-20 ENCOUNTER — Encounter (HOSPITAL_COMMUNITY): Payer: Self-pay | Admitting: *Deleted

## 2016-03-20 LAB — RPR: RPR Ser Ql: NONREACTIVE

## 2016-03-20 NOTE — Anesthesia Postprocedure Evaluation (Signed)
Anesthesia Post Note  Patient: Kimberly Bennett  Procedure(s) Performed: * No procedures listed *  Patient location during evaluation: Mother Baby Anesthesia Type: Epidural Level of consciousness: awake and alert, oriented and patient cooperative Pain management: pain level controlled Vital Signs Assessment: post-procedure vital signs reviewed and stable Respiratory status: spontaneous breathing Cardiovascular status: stable Postop Assessment: no headache, epidural receding, patient able to bend at knees and no signs of nausea or vomiting Anesthetic complications: no     Last Vitals:  Vitals:   03/20/16 0122 03/20/16 0610  BP: 135/82 136/90  Pulse: 82 69  Resp: 20 16  Temp: 36.5 C 36.4 C    Last Pain:  Vitals:   03/20/16 0610  TempSrc: Oral  PainSc:    Pain Goal: Patients Stated Pain Goal: 0 (03/19/16 2323)               Rico Sheehan

## 2016-03-20 NOTE — Progress Notes (Signed)
Patient ID: MAUDELLE CAPOTE, female   DOB: March 15, 1981, 35 y.o.   MRN: [id number] Post Partum Day 1 SVD and BTL Subjective: No acute events overnight. + urinating. Has had a couple "gushes" of blood and passed some clots, last occurred at 0600 today. Denies h/a, SOB, dizziness, flatus, calf pain or swelling. GBS + and understands she needs to stay one more day for abx. MOF: br and bt. MOC: BTL  Objective: Blood pressure 136/90, pulse 69, temperature 97.5 F (36.4 C), temperature source Oral, resp. rate 16, height 5\' 5"  (1.651 m), weight 76.7 kg (169 lb), last menstrual period 06/20/2015, SpO2 96 %, unknown if currently breastfeeding.  Physical Exam:  General: alert, cooperative and no distress Lochia: appropriate Uterine Fundus: firm Incision: healing well DVT Evaluation: No evidence of DVT seen on physical exam.   Recent Labs  03/19/16 1030  HGB 10.0*  HCT 32.3*   Results for orders placed or performed during the hospital encounter of 03/19/16 (from the past 24 hour(s))  CBC     Status: Abnormal   Collection Time: 03/19/16 10:30 AM  Result Value Ref Range   WBC 12.1 (H) 4.0 - 10.5 K/uL   RBC 4.93 3.87 - 5.11 MIL/uL   Hemoglobin 10.0 (L) 12.0 - 15.0 g/dL   HCT 32.3 (L) 36.0 - 46.0 %   MCV 65.5 (L) 78.0 - 100.0 fL   MCH 20.3 (L) 26.0 - 34.0 pg   MCHC 31.0 30.0 - 36.0 g/dL   RDW 18.0 (H) 11.5 - 15.5 %   Platelets 359 150 - 400 K/uL  Type and screen [place]     Status: None   Collection Time: 03/19/16 10:30 AM  Result Value Ref Range   ABO/RH(D) O POS    Antibody Screen NEG    Sample Expiration 03/22/2016   RPR     Status: None   Collection Time: 03/19/16 10:30 AM  Result Value Ref Range   RPR Ser Ql Non Reactive Non Reactive     Assessment/Plan: Plan for discharge tomorrow  GBS+ w/ inadequate treatment. Continue abx today.  Lactation will follow up this evening.    LOS: 1 day   Francesco Runner 03/20/2016, 7:57 AM

## 2016-03-20 NOTE — Lactation Note (Signed)
This note was copied from a baby's chart. Lactation Consultation Note Experienced BF mom breast and formula as she has done w/all 4 of her other children. Mom stated she doesn't think the baby is getting anything. Baby has voided. Taught hand expression w/none noted at this time. Manual pump given for stimulation. Mom has puffy everted nipples and areolas. Mom hasn't given formula yet, encourage to wait as long as possible before giving formula to help her have a better milk supply. Discussed supply and demand.  Encouraged BF before supplementing. Mom encouraged to feed baby 8-12 times/24 hours and with feeding cues. Discussed I&O, STS, and cluster feeding. Referred to Baby and Me Book in Breastfeeding section Pg. 22-23 for position options and Proper latch demonstration.[place] brochure given w/resources, support groups and [facility] services. Patient Name: Kimberly Bennett [id number] Date: 03/20/2016 Reason for consult: Initial assessment   Maternal Data Has patient been taught Hand Expression?: Yes Does the patient have breastfeeding experience prior to this delivery?: Yes  Feeding Feeding Type: Breast Fed Length of feed: 10 min  LATCH Score/Interventions       Type of Nipple: Everted at rest and after stimulation  Comfort (Breast/Nipple): Soft / non-tender     Hold (Positioning): Assistance needed to correctly position infant at breast and maintain latch. Intervention(s): Skin to skin;Position options;Support Pillows;Breastfeeding basics reviewed     Lactation Tools Discussed/Used Tools: Pump Breast pump type: Manual Pump Review: Setup, frequency, and cleaning;Milk Storage Initiated by:: Allayne Stack RN Date initiated:: 03/20/16   Consult Status Consult Status: Follow-up Date: 03/20/16 (in pm) Follow-up type: In-patient    Theodoro Kalata 03/20/2016, 2:37 AM

## 2016-03-20 NOTE — Progress Notes (Signed)
POSTPARTUM PROGRESS NOTE  Post Partum Day 1 Subjective:  Kimberly Bennett is a 35 y.o. [id number] [redacted]w[redacted]d s/p SVD.  No acute events overnight.  Pt denies problems with ambulating, voiding or po intake.  She denies nausea or vomiting.  Pain is moderately controlled.  She has had flatus. She has not had bowel movement.  Lochia Small.   Objective: Blood pressure 136/90, pulse 69, temperature 97.5 F (36.4 C), temperature source Oral, resp. rate 16, height 5\' 5"  (1.651 m), weight 169 lb (76.7 kg), last menstrual period 06/20/2015, SpO2 96 %, unknown if currently breastfeeding.  Physical Exam:  General: alert, cooperative and no distress Lochia:normal flow Chest: CTAB Heart: RRR no m/r/g Abdomen: +BS, soft, nontender,  Uterine Fundus: firm, appropriately tender DVT Evaluation: No calf swelling or tenderness Extremities: trace edema   Recent Labs  03/19/16 1030  HGB 10.0*  HCT 32.3*    Assessment/Plan:  ASSESSMENT: Kimberly Bennett is a 35 y.o. [id number] [redacted]w[redacted]d s/p SVD.   Plan for discharge tomorrow, Breastfeeding and Lactation consult   LOS: 1 day   Jacquiline Doe 03/20/2016, 9:02 AM

## 2016-03-20 NOTE — Lactation Note (Signed)
This note was copied from a baby's chart. Lactation Consultation Note  Patient Name: Boy Quaniesha Mukherjee [id number] Date: 03/20/2016 Reason for consult: Initial assessment Baby at 24 hr of life and mom does not think she has enough milk at this time. She breast and formula fed her 35 yr old until her milk came in then went to ebf. When she started back to work he would not take all the bottles she sent to daycare and the daycare told her he would cry for her. She wants this baby to take bottles and not cry when she returns to work. Discussed baby behavior, feeding frequency, pumping, baby belly size, voids, wt loss, breast changes, and nipple care. She has a Harmony in the room but is "not getting anything". She knows how to manually express "but only gets drops". Reviewed spoon feeding. Discussed DEBP options for return to work and reaching out to [facility]. She is aware of lactation services and support group. She will call as needed.     Maternal Data    Feeding    LATCH Score/Interventions                      Lactation Tools Discussed/Used     Consult Status Consult Status: Follow-up Date: 03/21/16 Follow-up type: In-patient    Denzil Hughes 03/20/2016, 5:09 PM

## 2016-03-20 NOTE — Progress Notes (Signed)
UR chart review completed.  

## 2016-03-21 MED ORDER — IBUPROFEN 600 MG PO TABS: 600.0000 mg | ORAL_TABLET | Freq: Four times a day (QID) | ORAL | 0 refills | Status: DC | PRN

## 2016-03-21 MED ORDER — OXYCODONE-ACETAMINOPHEN 5-325 MG PO TABS: 1.0000 | ORAL_TABLET | ORAL | 0 refills | Status: DC | PRN

## 2016-03-21 NOTE — Lactation Note (Signed)
This note was copied from a baby's chart. Lactation Consultation Note  Patient Name: Kimberly Bennett [id number] Date: 03/21/2016  Follow up visit made prior to discharge.  Mom states her milk is coming in so she is decreasing amount of formula given. Breastfeeding prior to supplement,  Lactation outpatient services reviewed and encouraged.   Maternal Data    Feeding    LATCH Score/Interventions                      Lactation Tools Discussed/Used     Consult Status      [name] 03/21/2016, 10:48 AM

## 2016-03-21 NOTE — Discharge Summary (Signed)
OB Discharge Summary     Patient Name: Kimberly Bennett DOB: 08-26-1980 MRN: [id number]  Date of admission: 03/19/2016 Delivering MD: Sammuel Bailiff CNM  Date of discharge: 03/21/2016  Admitting diagnosis: 56 WEEKS CTX desires sterilization Intrauterine pregnancy: [redacted]w[redacted]d     Secondary diagnosis:  Active Problems:   Active labor   NSVD (normal spontaneous vaginal delivery)   Encounter for sterilization  Additional problems: none     Discharge diagnosis: Term Pregnancy Delivered                                                                                                Post partum procedures:postpartum tubal ligation  Augmentation: AROM  Complications: None  Hospital course:  Onset of Labor With Vaginal Delivery     35 y.o. yo [id number] at [redacted]w[redacted]d was admitted in Active Labor on 03/19/2016. Patient had an uncomplicated labor course as follows:  Membrane Rupture Time/Date: 2:35 PM ,03/19/2016   Intrapartum Procedures: Episiotomy: None [1]                                         Lacerations:     Patient had a delivery of a Viable infant. 03/19/2016  Information for the patient's newborn:  Kinlyn, [place]dee [id number]  Delivery Method: Vaginal, Spontaneous Delivery (Filed from Delivery Summary)    Pateint had an uncomplicated postpartum course including a BTL on PPD#0.  She is ambulating, tolerating a regular diet, passing flatus, and urinating well. Patient is discharged home in stable condition on 03/21/16.    Physical exam Vitals:   03/20/16 1033 03/20/16 1304 03/20/16 1700 03/21/16 0627  BP: 125/88 (!) 102/53 (!) 104/50 108/61  Pulse: 88 82 81 84  Resp: 18   18  Temp: 97.9 F (36.6 C) 98.3 F (36.8 C) 99 F (37.2 C) 98.1 F (36.7 C)  TempSrc:      SpO2:  98%    Weight:      Height:       General: alert and cooperative Lochia: appropriate Uterine Fundus: firm Incision: Healing well with no significant drainage DVT Evaluation: No evidence of  DVT seen on physical exam. Labs: Lab Results  Component Value Date   WBC 12.1 (H) 03/19/2016   HGB 10.0 (L) 03/19/2016   HCT 32.3 (L) 03/19/2016   MCV 65.5 (L) 03/19/2016   PLT 359 03/19/2016   CMP Latest Ref Rng & Units 08/01/2015  Glucose 65 - 99 mg/dL 80  BUN 6 - 20 mg/dL 5(L)  Creatinine 0.44 - 1.00 mg/dL 0.48  Sodium 135 - 145 mmol/L 139  Potassium 3.5 - 5.1 mmol/L 3.7  Chloride 101 - 111 mmol/L 101  CO2 22 - 32 mmol/L 28  Calcium 8.9 - 10.3 mg/dL 10.0  Total Protein 6.5 - 8.1 g/dL 7.6  Total Bilirubin 0.3 - 1.2 mg/dL 0.5  Alkaline Phos 38 - 126 U/L 46  AST 15 - 41 U/L 14(L)  ALT 14 - 54 U/L 10(L)  Discharge instruction: per After Visit Summary and "Baby and Me Booklet".  After visit meds:    Medication List    TAKE these medications   ibuprofen 600 MG tablet Commonly known as:  ADVIL,MOTRIN Take 1 tablet (600 mg total) by mouth every 6 (six) hours as needed.   oxyCODONE-acetaminophen 5-325 MG tablet Commonly known as:  ROXICET Take 1 tablet by mouth every 4 (four) hours as needed for severe pain.       Diet: routine diet  Activity: Advance as tolerated. Pelvic rest for 6 weeks.   Outpatient follow up:6 weeks Follow up Appt:No future appointments. Follow up Visit:No Follow-up on file.  Postpartum contraception: Tubal Ligation  Newborn Data: Live born female  Birth Weight: 7 lb 4.6 oz (3305 g) APGAR: 7, 7  Baby Feeding: Bottle and Breast Disposition:home with mother   03/21/2016 Serita Grammes, CNM 9:08 AM

## 2016-03-21 NOTE — Discharge Instructions (Signed)
Postpartum Tubal Ligation, Care After Refer to this sheet in the next few weeks. These instructions provide you with information about caring for yourself after your procedure. Your health care provider may also give you more specific instructions. Your treatment has been planned according to current medical practices, but problems sometimes occur. Call your health care provider if you have any problems or questions after your procedure. WHAT TO EXPECT AFTER THE PROCEDURE After your procedure, it is common to have:  Sore throat.  Soreness at the incision site.  Mild cramping.  Tiredness.  Mild nausea or vomiting. HOME CARE INSTRUCTIONS  Rest for the remainder of the day.  Take medicines only as directed by your health care provider. These include over-the-counter medicines and prescription medicines. Do not take aspirin, which can cause bleeding.  Over the next few days, gradually return to your normal activities and your normal diet.  Avoid sexual intercourse for 2 weeks or as directed by your health care provider.  Do not drive or operate heavy machinery while taking pain medicine.  Do not lift anything that is heavier than 5 lb (2.3 kg) for 2 weeks or as directed by your health care provider.  Do not take baths. Take showers only. Ask your health care provider when you can start taking baths.  Take your temperature twice each day and write it down.  Try to have help for the first 7-10 days for your household needs.  There are many different ways to close and cover an incision, including stitches (sutures), skin glue, and adhesive strips. Follow instructions from your health care provider about:  Incision care.  Bandage (dressing) changes and removal.  Incision closure removal.  Check your incision area every day for signs of infection. Watch for:  Redness, swelling, or pain.  Fluid, blood, or pus.  Keep all follow-up visits as directed by your health care  provider. SEEK MEDICAL CARE IF:  You have redness, swelling, or increasing pain in your incision area.  You have fluid or pus coming from your incision for longer than 1 day.  You notice a bad smell coming from your incision or your dressing.  The edges of your incision break open after the sutures have been removed.  Your pain does not decrease after 2-3 days.  You have a rash.  You repeatedly become dizzy or light-headed.  You have a reaction to your medicine.  Your pain medicine is not helping.  You are constipated. SEEK IMMEDIATE MEDICAL CARE IF:   You have a fever.  You faint.  You have increasing pain in your abdomen.  You have bleeding or drainage from your suture sites or your vagina after surgery.  You have shortness of breath or have difficulty breathing.  You have chest pain or leg pain.  You have ongoing nausea, vomiting, or diarrhea.   This information is not intended to replace advice given to you by your health care provider. Make sure you discuss any questions you have with your health care provider.   Document Released: 02/09/2012 Document Revised: 12/25/2014 Document Reviewed: 02/09/2012 Elsevier Interactive Patient Education 2016 [place]. Postpartum Care After Vaginal Delivery After you deliver your newborn (postpartum period), the usual stay in the hospital is 24-72 hours. If there were problems with your labor or delivery, or if you have other medical problems, you might be in the hospital longer.  While you are in the hospital, you will receive help and instructions on how to care for yourself and your  newborn during the postpartum period.  While you are in the hospital:  Be sure to tell your nurses if you have pain or discomfort, as well as where you feel the pain and what makes the pain worse.  If you had an incision made near your vagina (episiotomy) or if you had some tearing during delivery, the nurses may put ice packs on your  episiotomy or tear. The ice packs may help to reduce the pain and swelling.  If you are breastfeeding, you may feel uncomfortable contractions of your uterus for a couple of weeks. This is normal. The contractions help your uterus get back to normal size.  It is normal to have some bleeding after delivery.  For the first 1-3 days after delivery, the flow is red and the amount may be similar to a period.  It is common for the flow to start and stop.  In the first few days, you may pass some small clots. Let your nurses know if you begin to pass large clots or your flow increases.  Do not  flush blood clots down the toilet before having the nurse look at them.  During the next 3-10 days after delivery, your flow should become more watery and pink or brown-tinged in color.  Ten to fourteen days after delivery, your flow should be a small amount of yellowish-white discharge.  The amount of your flow will decrease over the first few weeks after delivery. Your flow may stop in 6-8 weeks. Most women have had their flow stop by 12 weeks after delivery.  You should change your sanitary pads frequently.  Wash your hands thoroughly with soap and water for at least 20 seconds after changing pads, using the toilet, or before holding or feeding your newborn.  You should feel like you need to empty your bladder within the first 6-8 hours after delivery.  In case you become weak, lightheaded, or faint, call your nurse before you get out of bed for the first time and before you take a shower for the first time.  Within the first few days after delivery, your breasts may begin to feel tender and full. This is called engorgement. Breast tenderness usually goes away within 48-72 hours after engorgement occurs. You may also notice milk leaking from your breasts. If you are not breastfeeding, do not stimulate your breasts. Breast stimulation can make your breasts produce more milk.  Spending as much time as  possible with your newborn is very important. During this time, you and your newborn can feel close and get to know each other. Having your newborn stay in your room (rooming in) will help to strengthen the bond with your newborn. It will give you time to get to know your newborn and become comfortable caring for your newborn.  Your hormones change after delivery. Sometimes the hormone changes can temporarily cause you to feel sad or tearful. These feelings should not last more than a few days. If these feelings last longer than that, you should talk to your caregiver.  If desired, talk to your caregiver about methods of family planning or contraception.  Talk to your caregiver about immunizations. Your caregiver may want you to have the following immunizations before leaving the hospital:  Tetanus, diphtheria, and pertussis (Tdap) or tetanus and diphtheria (Td) immunization. It is very important that you and your family (including grandparents) or others caring for your newborn are up-to-date with the Tdap or Td immunizations. The Tdap or Td immunization can  help protect your newborn from getting ill.  Rubella immunization.  Varicella (chickenpox) immunization.  Influenza immunization. You should receive this annual immunization if you did not receive the immunization during your pregnancy.   This information is not intended to replace advice given to you by your health care provider. Make sure you discuss any questions you have with your health care provider.   Document Released: 06/07/2007 Document Revised: 05/04/2012 Document Reviewed: 04/06/2012 Elsevier Interactive Patient Education [employer].

## 2016-03-23 ENCOUNTER — Encounter (HOSPITAL_COMMUNITY): Payer: Self-pay | Admitting: Obstetrics and Gynecology

## 2016-05-07 ENCOUNTER — Encounter: Payer: Self-pay | Admitting: Advanced Practice Midwife

## 2016-05-07 ENCOUNTER — Ambulatory Visit (INDEPENDENT_AMBULATORY_CARE_PROVIDER_SITE_OTHER): Payer: Medicaid Other | Admitting: Advanced Practice Midwife

## 2016-05-07 VITALS — BP 152/99 | HR 67 | Wt 149.6 lb

## 2016-05-07 DIAGNOSIS — O9229 Other disorders of breast associated with pregnancy and the puerperium: Secondary | ICD-10-CM

## 2016-05-07 DIAGNOSIS — O165 Unspecified maternal hypertension, complicating the puerperium: Secondary | ICD-10-CM

## 2016-05-07 MED ORDER — AMLODIPINE BESYLATE 10 MG PO TABS: 10.0000 mg | ORAL_TABLET | Freq: Every day | ORAL | 1 refills | Status: DC

## 2016-05-07 NOTE — Progress Notes (Signed)
Subjective:     Kimberly Bennett is a 35 y.o. female who presents for a postpartum visit. She is 8 weeks postpartum following a spontaneous vaginal delivery. I have fully reviewed the prenatal and intrapartum course. The delivery was at 13 gestational weeks. Outcome: spontaneous vaginal delivery. Anesthesia: epidural. Postpartum course has been unremarkable. Baby's course has been unremarkable. Baby is feeding by breast. Pt reports nipple pain at the beginning of nursing. Discussed pain in hospital w/ LC an w/ WIC LC once after discharge. Bleeding staining only. Bowel function is normal. Bladder function is normal. Patient is not sexually active. Contraception method is tubal ligation. Postpartum depression screening: negative.  The following portions of the patient's history were reviewed and updated as appropriate: allergies, current medications, past family history, past medical history, past social history, past surgical history and problem list. No Hx CNTH. Did have HTN "for a while" after last child, but was not placed on meds. Normal btw pregnancy per pt.   Review of Systems Pertinent items are noted in HPI.   Denies HA, vision changes or epigastric pain.   Objective:    BP (!) 152/99 (BP Location: Left Arm, Patient Position: Sitting, Cuff Size: Normal)   Pulse 67   Wt 149 lb 9.6 oz (67.9 kg)   Breastfeeding? Yes   BMI 24.89 kg/m   General:  alert, cooperative, appears stated age and no distress   Breasts:  inspection negative, no nipple discharge or bleeding, no masses or nodularity palpable  Lungs: clear to auscultation bilaterally  Heart:  regular rate and rhythm, S1, S2 normal, no murmur, click, rub or gallop  Abdomen: soft, non-tender; bowel sounds normal; no masses,  no organomegaly   Vulva:  not evaluated  Vagina: not evaluated  Rectal Exam: Not performed.        Assessment:   - Postpartum exam complicated by gestational HTN w/out evidence of Pre-E.  - Nipple pain likely  due to poor latch.   Plan:    1. Contraception: tubal ligation 2. LC contact info given. Discussed ways to improve latch.  3. Follow up in: 1 year or as needed.   4. Rx Norvasc. BP check in 1 week. Refer to PCP if not normal by 12 weeks PP.

## 2016-05-07 NOTE — Patient Instructions (Addendum)
Hypertension During Pregnancy Hypertension, or high blood pressure, is when there is extra pressure inside your blood vessels that carry blood from the heart to the rest of your body (arteries). It can happen at any time in life, including pregnancy. Hypertension during pregnancy can cause problems for you and your baby. Your baby might not weigh as much as he or she should at birth or might be born early (premature). Very bad cases of hypertension during pregnancy can be life-threatening.  Different types of hypertension can occur during pregnancy. These include:  Chronic hypertension. This happens when a woman has hypertension before pregnancy and it continues during pregnancy.  Gestational hypertension. This is when hypertension develops during pregnancy.  Preeclampsia or toxemia of pregnancy. This is a very serious type of hypertension that develops only during pregnancy. It affects the whole body and can be very dangerous for both mother and baby.  Gestational hypertension and preeclampsia usually go away after your baby is born. Your blood pressure will likely stabilize within 6 weeks. Women who have hypertension during pregnancy have a greater chance of developing hypertension later in life or with future pregnancies. RISK FACTORS There are certain factors that make it more likely for you to develop hypertension during pregnancy. These include:  Having hypertension before pregnancy.  Having hypertension during a previous pregnancy.  Being overweight.  Being older than 40 years.  Being pregnant with more than one baby.  Having diabetes or kidney problems. SIGNS AND SYMPTOMS Chronic and gestational hypertension rarely cause symptoms. Preeclampsia has symptoms, which may include:  Increased protein in your urine. Your health care provider will check for this at every prenatal visit.  Swelling of your hands and face.  Rapid weight gain.  Headaches.  Visual changes.  Being  bothered by light.  Abdominal pain, especially in the upper right area.  Chest pain.  Shortness of breath.  Increased reflexes.  Seizures. These occur with a more severe form of preeclampsia, called eclampsia. DIAGNOSIS  You may be diagnosed with hypertension during a regular prenatal exam. At each prenatal visit, you may have:  Your blood pressure checked.  A urine test to check for protein in your urine. The type of hypertension you are diagnosed with depends on when you developed it. It also depends on your specific blood pressure reading.  Developing hypertension before 20 weeks of pregnancy is consistent with chronic hypertension.  Developing hypertension after 20 weeks of pregnancy is consistent with gestational hypertension.  Hypertension with increased urinary protein is diagnosed as preeclampsia.  Blood pressure measurements that stay above 160 systolic or 110 diastolic are a sign of severe preeclampsia. TREATMENT Treatment for hypertension during pregnancy varies. Treatment depends on the type of hypertension and how serious it is.  If you take medicine for chronic hypertension, you may need to switch medicines.  Medicines called ACE inhibitors should not be taken during pregnancy.  Low-dose aspirin may be suggested for women who have risk factors for preeclampsia.  If you have gestational hypertension, you may need to take a blood pressure medicine that is safe during pregnancy. Your health care provider will recommend the correct medicine.  If you have severe preeclampsia, you may need to be in the hospital. Health care providers will watch you and your baby very closely. You also may need to take medicine called magnesium sulfate to prevent seizures and lower blood pressure.  Sometimes, an early delivery is needed. This may be the case if the condition worsens. It would be   done to protect you and your baby. The only cure for preeclampsia is delivery.  Your health  care provider may recommend that you take one low-dose aspirin (81 mg) each day to help prevent high blood pressure during your pregnancy if you are at risk for preeclampsia. You may be at risk for preeclampsia if:  You had preeclampsia or eclampsia during a previous pregnancy.  Your baby did not grow as expected during a previous pregnancy.  You experienced preterm birth with a previous pregnancy.  You experienced a separation of the placenta from the uterus (placental abruption) during a previous pregnancy.  You experienced the loss of your baby during a previous pregnancy.  You are pregnant with more than one baby.  You have other medical conditions, such as diabetes or an autoimmune disease. HOME CARE INSTRUCTIONS  Schedule and keep all of your regular prenatal care appointments. This is important.  Take medicines only as directed by your health care provider. Tell your health care provider about all medicines you take.  Eat as little salt as possible.  Get regular exercise.  Do not drink alcohol.  Do not use tobacco products.  Do not drink products with caffeine.  Lie on your left side when resting. SEEK IMMEDIATE MEDICAL CARE IF:  You have severe abdominal pain.  You have sudden swelling in your hands, ankles, or face.  You gain 4 pounds (1.8 kg) or more in 1 week.  You vomit repeatedly.  You have vaginal bleeding.  You do not feel your baby moving as much.  You have a headache.  You have blurred or double vision.  You have muscle twitching or spasms.  You have shortness of breath.  You have blue fingernails or lips.  You have blood in your urine. MAKE SURE YOU:  Understand these instructions.  Will watch your condition.  Will get help right away if you are not doing well or get worse.   This information is not intended to replace advice given to you by your health care provider. Make sure you discuss any questions you have with your health care  provider.   Document Released: 04/28/2011 Document Revised: 08/31/2014 Document Reviewed: 03/09/2013 Elsevier Interactive Patient Education 2016 [employer].   Breastfeeding Challenges and Solutions Even though breastfeeding is natural, it can be challenging, especially in the first few weeks after childbirth. It is normal for problems to arise when starting to breastfeed your new baby, even if you have breastfed before. This document provides some solutions to the most common breastfeeding challenges.  CHALLENGES AND SOLUTIONS Challenge--Cracked or Sore Nipples Cracked or sore nipples are commonly experienced by breastfeeding mothers. Cracked or sore nipples often are caused by inadequate latching (when your baby's mouth attaches to your breast to breastfeed). Soreness can also happen if your baby is not positioned properly at your breast. Although nipple cracking and soreness are common during the first week after birth, nipple pain is never normal. If you experience nipple cracking or soreness that lasts longer than 1 week or nipple pain, call your health care provider or lactation consultant.  Solution Ensure proper latching and positioning of your baby by following the steps below:  Find a comfortable place to sit or lie down, with your neck and back well supported.  Place a pillow or rolled up blanket under your baby to bring him or her to the level of your breast (if you are seated).  Make sure that your baby's abdomen is facing your abdomen.  Gently massage your breast. With your fingertips, massage from your chest wall toward your nipple in a circular motion. This encourages milk flow. You may need to continue this action during the feeding if your milk flows slowly.  Support your breast with 4 fingers underneath and your thumb above your nipple. Make sure your fingers are well away from your nipple and your baby's mouth.  Stroke your baby's lips gently with your finger or  nipple.  When your baby's mouth is open wide enough, quickly bring your baby to your breast, placing your entire nipple and as much of the colored area around your nipple (areola) as possible into your baby's mouth.  More areola should be visible above your baby's upper lip than below the lower lip.  Your baby's tongue should be between his or her lower gum and your breast.  Ensure that your baby's mouth is correctly positioned around your nipple (latched). Your baby's lips should create a seal on your breast and be turned out (everted).  It is common for your baby to suck for about 2-3 minutes in order to start the flow of breast milk. Signs that your baby has successfully latched on to your nipple include:   Quietly tugging or quietly sucking without causing you pain.   Swallowing heard between every 3-4 sucks.   Muscle movement above and in front of his or her ears with sucking.  Signs that your baby has not successfully latched on to nipple include:   Sucking sounds or smacking sounds from your baby while nursing.   Nipple pain.  Ensure that your breasts stay moisturized and healthy by:  Avoiding the use of soap on your nipples.   Wearing a supportive bra. Avoid wearing underwire-style bras or tight bras.   Air drying your nipples for 3-4 minutes after each feeding.   Using only cotton bra pads to absorb breast milk leakage. Leaking of breast milk between feedings is normal. Be sure to change the pads if they become soaked with milk.  Using lanolin on your nipples after nursing. Lanolin helps to maintain your skin's normal moisture barrier. If you use pure lanolin you do not need to wash it off before feeding your baby again. Pure lanolin is not toxic to your baby. You may also hand express a few drops of breast milk and gently massage that milk into your nipples, allowing it to air dry. Challenge--Breast Engorgement Breast engorgement is the overfilling of your  breasts with breast milk. In the first few weeks after giving birth, you may experience breast engorgement. Breast engorgement can make your breasts throb and feel hard, tightly stretched, warm, and tender. Engorgement peaks about the fifth day after you give birth. Having breast engorgement does not mean you have to stop breastfeeding your baby. Solution  Breastfeed when you feel the need to reduce the fullness of your breasts or when your baby shows signs of hunger. This is called "breastfeeding on demand."  Newborns (babies younger than 4 weeks) often breastfeed every 1-3 hours during the day. You may need to awaken your baby to feed if he or she is asleep at a feeding time.  Do not allow your baby to sleep longer than 5 hours during the night without a feeding.  Pump or hand express breast milk before breastfeeding to soften your breast, areola, and nipple.  Apply warm, moist heat (in the shower or with warm water-soaked hand towels) just before feeding or pumping, or massage your breast before  or during breastfeeding. This increases circulation and helps your milk to flow.  Completely empty your breasts when breastfeeding or pumping. Afterward, wear a snug bra (nursing or regular) or tank top for 1-2 days to signal your body to slightly decrease milk production. Only wear snug bras or tank tops to treat engorgement. Tight bras typically should be avoided by breastfeeding mothers. Once engorgement is relieved, return to wearing regular, loose-fitting clothes.  Apply ice packs to your breasts to lessen the pain from engorgement and relieve swelling, unless the ice is uncomfortable for you.  Do not delay feedings. Try to relax when it is time to feed your baby. This helps to trigger your "let-down reflex," which releases milk from your breast.  Ensure your baby is latched on to your breast and positioned properly while breastfeeding.  Allow your baby to remain at your breast as long as he or  she is latched on well and actively sucking. Your baby will let you know when he or she is done breastfeeding by pulling away from your breast or falling asleep.  Avoid introducing bottles or pacifiers to your baby in the early weeks of breastfeeding. Wait to introduce these things until after resolving any breastfeeding challenges.  Try to pump your milk on the same schedule as when your baby would breastfeed if you are returning to work or away from home for an extended period.  Drink plenty of fluids to avoid dehydration, which can eventually put you at greater risk of breast engorgement. If you follow these suggestions, your engorgement should improve in 24-48 hours. If you are still experiencing difficulty, call your lactation consultant or health care provider.  Challenge--Plugged Milk Ducts Plugged milk ducts occur when the duct does not drain milk effectively and becomes swollen. Wearing a tight-fitting nursing bra or having difficulty with latching may cause plugged milk ducts. Not drinking enough water (8-10 c [1.9-2.4 L] per day) can contribute to plugged milk ducts. Once a duct has become plugged, hard lumps, soreness, and redness may develop in your breast.  Solution Do not delay feedings. Feed your baby frequently and try to empty your breasts of milk at each feeding. Try breastfeeding from the affected side first so there is a better chance that the milk will drain completely from that breast. Apply warm, moist towels to your breasts for 5-10 minutes before feeding. Alternatively, a hot shower right before breastfeeding can provide the moist heat that can encourage milk flow. Gentle massage of the sore area before and during a feeding may also help. Avoid wearing tight clothing or bras that put pressure on your breasts. Wear bras that offer good support to your breasts, but avoid underwire bras. If you have a plugged milk duct and develop a fever, you need to see your health care provider.   Challenge--Mastitis Mastitis is inflammation of your breast. It usually is caused by a bacterial infection and can cause flu-like symptoms. You may develop redness in your breast and a fever. Often when mastitis occurs, your breast becomes firm, warm, and very painful. The most common causes of mastitis are poor latching, ineffective sucking from your baby, consistent pressure on your breast (possibly from wearing a tight-fitting bra or shirt that restricts the milk flow), unusual stress or fatigue, or missed feedings.  Solution You will be given antibiotic medicine to treat the infection. It is still important to breastfeed frequently to empty your breasts. Continuing to breastfeed while you recover from mastitis will not harm your  baby. Make sure your baby is positioned properly during every feeding. Apply moist heat to your breasts for a few minutes before feeding to help the milk flow and to help your breasts empty more easily. Challenge--Thrush Ritta Slot is a yeast infection that can form on your nipples, in your breast, or in your baby's mouth. It causes itching, soreness, burning or stabbing pain, and sometimes a rash.  Solution You will be given a medicated ointment for your nipples, and your baby will be given a liquid medicine for his or her mouth. It is important that you and your baby are treated at the same time because thrush can be passed between you and your baby. Change disposable nursing pads often. Any bras, towels, or clothing that come in contact with infected areas of your body or your baby's body need to be washed in very hot water every day. Wash your hands and your baby's hands often. All pacifiers, bottle nipples, or toys your baby puts in his or her mouth should be boiled once a day for 20 minutes. After 1 week of treatment, discard pacifiers and bottle nipples and buy new ones. All breast pump parts that touch the milk need to be boiled for 20 minutes every day. Challenge--Low Milk  Supply You may not be producing enough milk if your baby is not gaining the proper amount of weight. Breast milk production is based on a supply-and-demand system. Your milk supply depends on how frequently and effectively your baby empties your breast. Solution The more you breastfeed and pump, the more breast milk you will produce. It is important that your baby empties at least one of your breasts at each feeding. If this is not happening, then use a breast pump or hand express any milk that remains. This will help to drain as much milk as possible at each feeding. It will also signal your body to produce more milk. If your baby is not emptying your breasts, it may be due to latching, sucking, or positioning problems. If low milk supply continues after addressing these issues, contact your health care provider or a lactation specialist as soon as possible. Challenge--Inverted or Flat Nipples Some women have nipples that turn inward instead of protruding outward. Other women have nipples that are flat. Inverted or flat nipples can sometimes make it more difficult for your baby to latch onto your breast. Solution You may be given a small device that pulls out inverted nipples. This device should be applied right before your baby is brought to your breast. You can also try using a breast pump for a short time before placing the baby at your breast. The pump can pull your nipple outwards to help your infant latch more easily. The baby's sucking motion will help the inverted nipple protrude as well.  If you have flat nipples, encourage your baby to latch onto your breast and feed frequently in the early days after birth. This will give your baby practice latching on correctly while your breast is still soft. When your milk supply increases, between the second and fifth day after birth and your breasts become full, your baby will have an easier time latching.  Contact a lactation consultant if you still have  concerns. She or he can teach you additional techniques to address breastfeeding problems related to nipple shape and position.  FOR MORE INFORMATION [employer]: www.llli.org   This information is not intended to replace advice given to you by your health care  provider. Make sure you discuss any questions you have with your health care provider.   Document Released: 02/01/2006 Document Revised: 08/31/2014 Document Reviewed: 02/03/2013 Elsevier Interactive Patient Education [employer].

## 2016-05-08 MED ORDER — AMLODIPINE BESYLATE 10 MG PO TABS: 10.0000 mg | ORAL_TABLET | Freq: Every day | ORAL | 1 refills | Status: AC

## 2016-05-13 ENCOUNTER — Ambulatory Visit: Payer: Medicaid Other

## 2016-05-13 VITALS — BP 129/96 | HR 91

## 2016-05-13 DIAGNOSIS — O165 Unspecified maternal hypertension, complicating the puerperium: Secondary | ICD-10-CM

## 2016-05-13 NOTE — Progress Notes (Signed)
Patient presented to office today for repeat blood pressure check. Blood pressure was 129/96 patient reports not taking her medication today. She reports she will take when she gets home. I advised patient to try and take at the same time everyday this will help her to remember. I have advised patient to follow up in two weeks to recheck. Per [facility] if patient blood pressure if not back to normal in 12 weeks. We need to refer her to PCP. Patient verbalize understanding at this time.

## 2016-05-19 ENCOUNTER — Encounter: Payer: Self-pay | Admitting: *Deleted

## 2016-05-27 ENCOUNTER — Telehealth: Payer: Self-pay | Admitting: *Deleted

## 2016-05-27 NOTE — Telephone Encounter (Signed)
Called pt regarding missed appt today @ 1115 for BP check. She stated that she forgot about the appt. She agreed to come in today @ 1545.

## 2016-05-28 ENCOUNTER — Ambulatory Visit: Payer: Self-pay

## 2016-05-28 DIAGNOSIS — R030 Elevated blood-pressure reading, without diagnosis of hypertension: Secondary | ICD-10-CM

## 2016-05-28 NOTE — Progress Notes (Signed)
Pt here today for repeat BP check.  BP taken manual  128/90 RA.  Pt reports not taking BP medication today.  She also denies any symptoms.  Notified Dr. Nehemiah Settle, who recommended that pt takes medication as prescribed and to have a referral to PCP.  Pt given contact # to [facility] and advised to call them today or tomorrow to get an appt scheduled.  Pt agreed.  Referral placed in [facility].

## 2016-06-19 IMAGING — US US MFM OB FOLLOW-UP
1 series · 14 of 28 positions shown · non-contrast
Comparison: none

[Series 1: us mfm ob follow-up · 66 acquisitions, 14 frames shown]
[im 3/66]
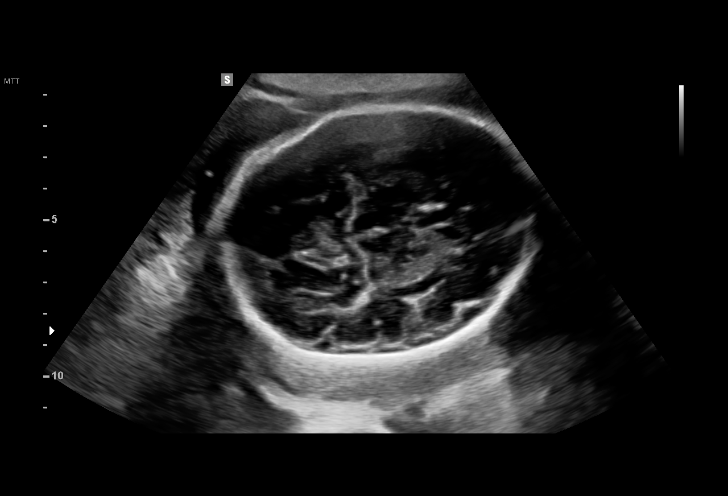
[im 8/66]
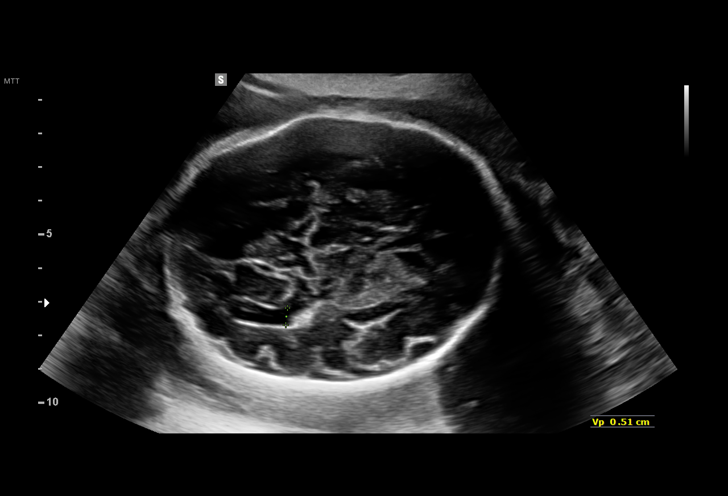
[im 13/66]
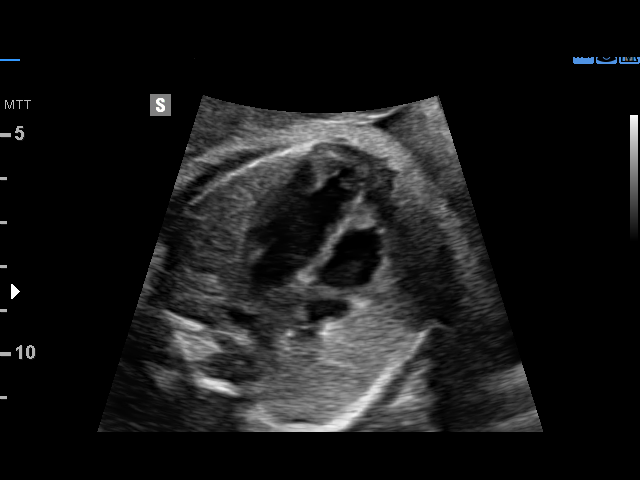
[im 17/66]
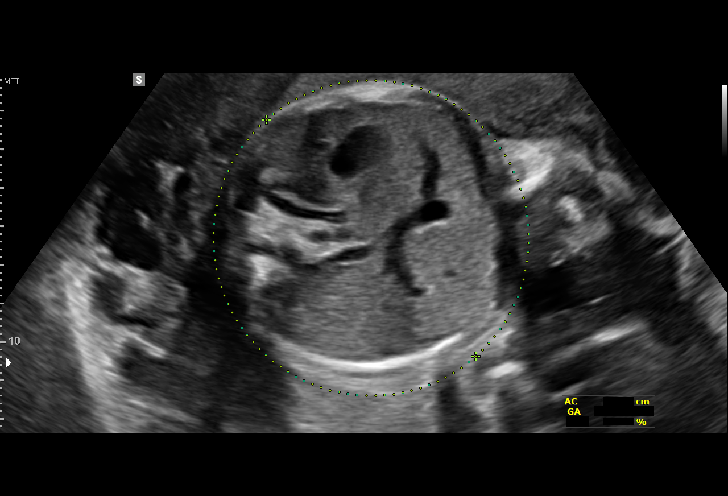
[im 22/66]
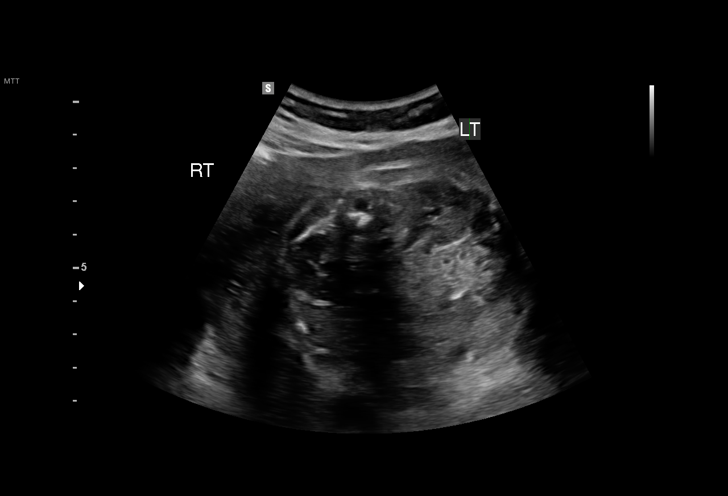
[im 27/66]
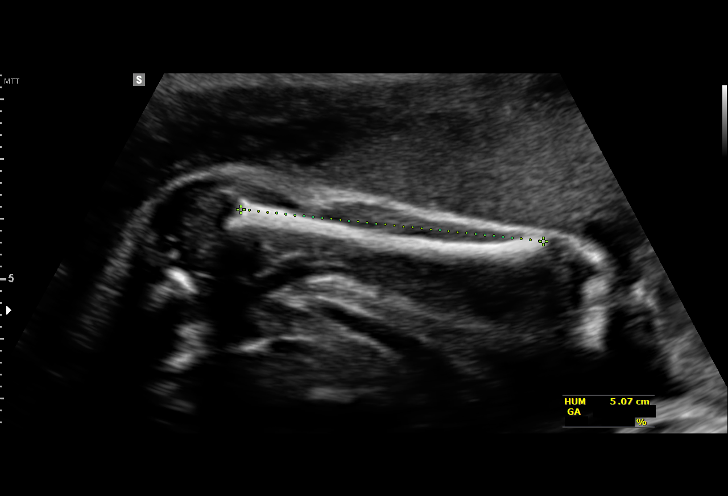
[im 32/66]
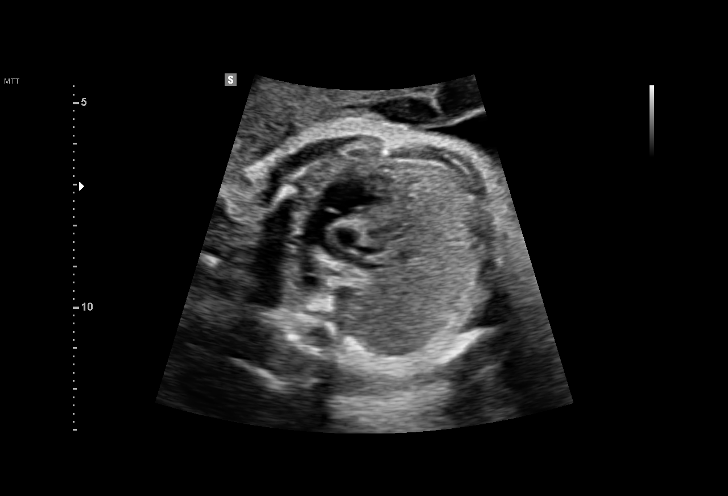
[im 37/66]
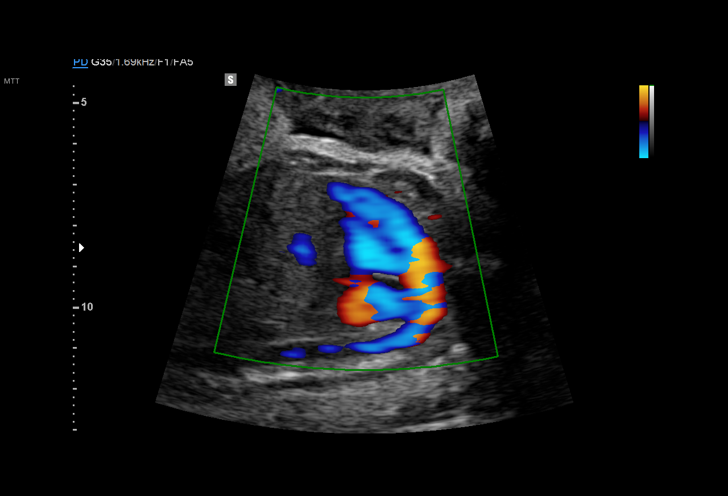
[im 41/66]
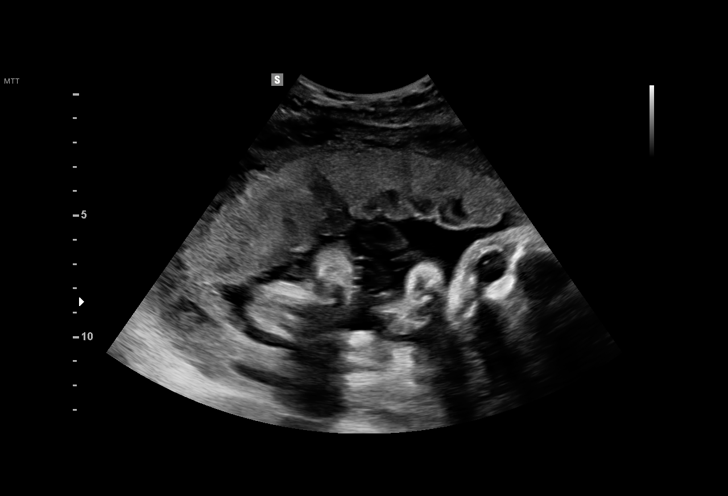
[im 46/66]
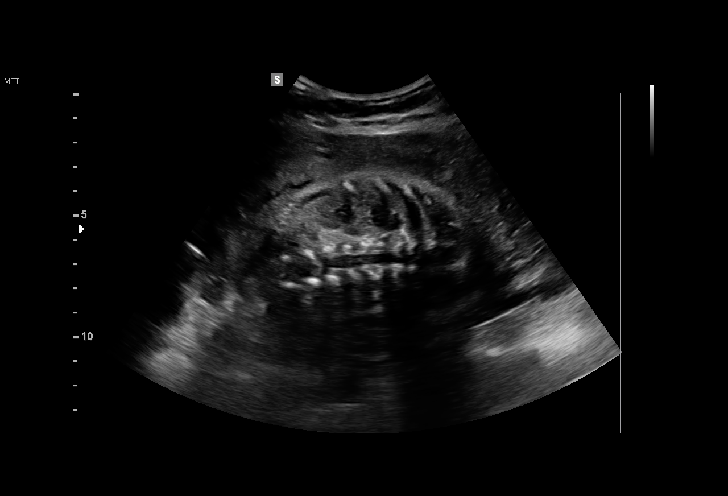
[im 51/66]
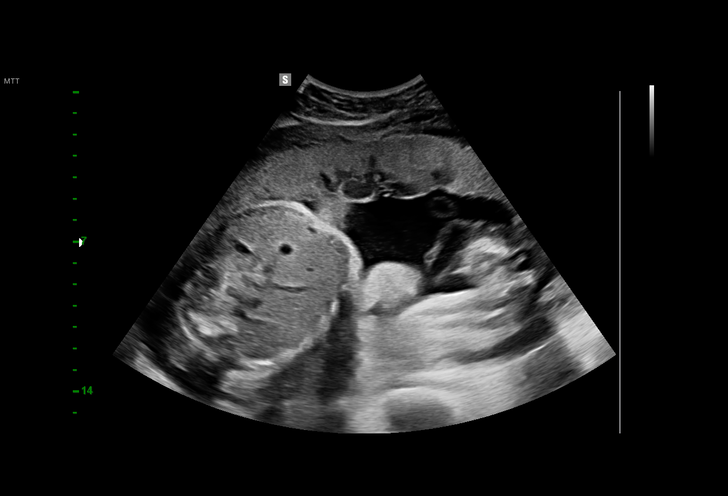
[im 56/66]
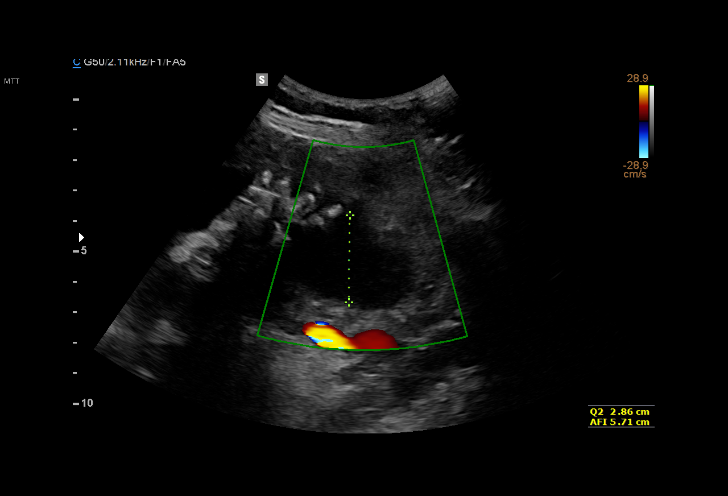
[im 61/66]
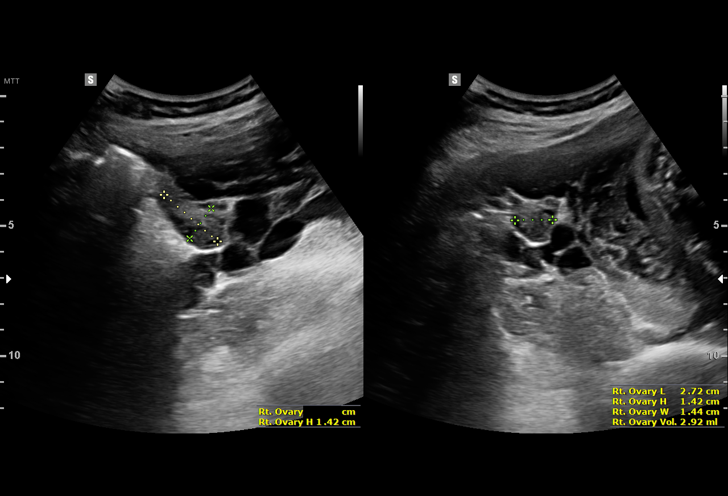
[im 66/66]
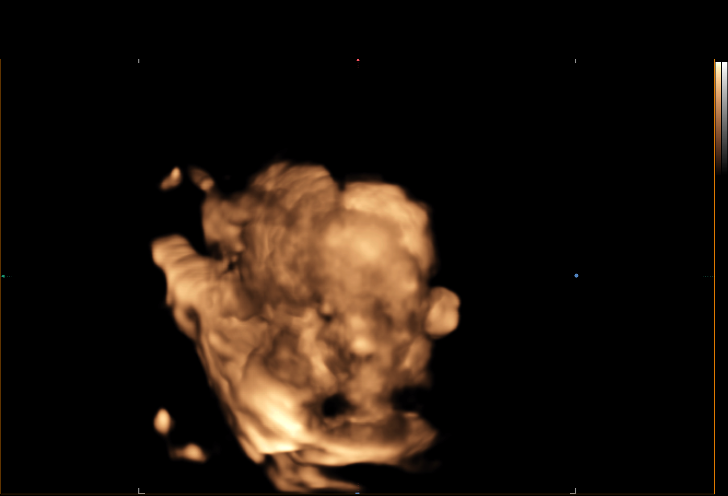

[14 of 28 positions shown; findings below may reference images not displayed]

1  NAZARETH JUMPER           [id number]      [id number]     [id number]
Indications

29 weeks gestation of pregnancy
Poor obstetric history: Previous preterm
delivery, antepartum (34 weeks)
Poor obstetric history: Previous fetal growth
restriction (FGR) x 2
Advanced maternal age multigravida 35+,
second trimester; low risk NIPS
Poor obstetric history: Previous
preeclampsia / PIH
Hypertension - Chronic/Pre-existing
OB History

Gravidity:    8         Term:   3        Prem:   1        SAB:   1
TOP:          1       Ectopic:  0        Living: 4
Fetal Evaluation

Num Of Fetuses:     1
Fetal Heart         137
Rate(bpm):
Cardiac Activity:   Observed
Placenta:           Anterior, above cervical os
P. Cord Insertion:  Previously Visualized

Amniotic Fluid
AFI FV:      Subjectively within normal limits

AFI Sum(cm)     %Tile       Largest Pocket(cm)
14.01           46
RUQ(cm)       RLQ(cm)       LUQ(cm)        LLQ(cm)
2.86
Biometry

BPD:      79.2  mm     G. Age:  31w 5d         92  %    CI:         74.7   %   70 - 86
FL/HC:      19.7   %   19.2 -
HC:      290.8  mm     G. Age:  32w 0d         81  %    HC/AC:      1.13       0.99 -
AC:      257.6  mm     G. Age:  30w 0d         51  %    FL/BPD:     72.2   %   71 - 87
FL:       57.2  mm     G. Age:  30w 0d         43  %    FL/AC:      22.2   %   20 - 24
HUM:      51.4  mm     G. Age:  30w 0d         57  %
CER:      37.9  mm     G. Age:  32w 3d         89  %

Est. FW:    9393  gm      3 lb 6 oz     63  %
Gestational Age

LMP:           29w 5d       Date:   06/20/15                 EDD:   03/26/16
U/S Today:     31w 0d                                        EDD:   03/17/16
Best:          29w 5d    Det. By:   LMP  (06/20/15)          EDD:   03/26/16
Anatomy

Cranium:               Appears normal         LVOT:                   Appears normal
Cavum:                 Appears normal         Aortic Arch:            Appears normal
Ventricles:            Appears normal         Ductal Arch:            Appears normal
Choroid Plexus:        Previously seen        Diaphragm:              Appears normal
Cerebellum:            Appears normal         Stomach:                Appears normal, left
sided
Posterior Fossa:       Previously seen        Abdomen:                Previously seen
Nuchal Fold:           Previously seen        Abdominal Wall:         Previously seen
Face:                  Orbits and profile     Cord Vessels:           Previously seen
previously seen
Lips:                  Previously seen        Kidneys:                Appear normal
Palate:                Previously seen        Bladder:                Appears normal
Thoracic:              Previously seen        Spine:                  Previously seen
Heart:                 Appears normal         Upper Extremities:      Previously seen
(4CH, axis, and
situs)
RVOT:                  Appears normal         Lower Extremities:      Previously seen

Other:  Male gender previously seen, Heels and 5th digit appears normal
previously.
Cervix Uterus Adnexa

Cervix
Not visualized (advanced GA >28wks)

Uterus
No abnormality visualized.

Left Ovary
Size(cm)     2.62  x    1.85   x  1.77      Vol(ml):
Within normal limits. No adnexal mass visualized.

Right Ovary
Size(cm)     2.72  x    1.44   x  1.42      Vol(ml):
Within normal limits. No adnexal mass visualized.

Cul De Sac:   No free fluid seen.

Adnexa:       No abnormality visualized.
Impression

Single IUP at 29w 5d
CHTN, prior preterm delivery, AMA
Interval fetal growth is appropriate (63rd %tile)
Anterior placenta without previa
Normal amniotic fluid volume
Recommendations

Recommend follow-up ultrasound examination in 4 weeks for
interval growth

## 2017-12-13 DIAGNOSIS — J309 Allergic rhinitis, unspecified: Secondary | ICD-10-CM | POA: Diagnosis not present

## 2018-01-27 DIAGNOSIS — Z008 Encounter for other general examination: Secondary | ICD-10-CM | POA: Diagnosis not present

## 2018-02-16 DIAGNOSIS — E039 Hypothyroidism, unspecified: Secondary | ICD-10-CM | POA: Diagnosis not present

## 2018-05-18 DIAGNOSIS — E039 Hypothyroidism, unspecified: Secondary | ICD-10-CM | POA: Diagnosis not present

## 2018-10-06 DIAGNOSIS — Z760 Encounter for issue of repeat prescription: Secondary | ICD-10-CM | POA: Diagnosis not present

## 2018-10-06 DIAGNOSIS — E039 Hypothyroidism, unspecified: Secondary | ICD-10-CM | POA: Diagnosis not present

## 2019-05-10 DIAGNOSIS — L810 Postinflammatory hyperpigmentation: Secondary | ICD-10-CM | POA: Diagnosis not present

## 2019-05-10 DIAGNOSIS — L680 Hirsutism: Secondary | ICD-10-CM | POA: Diagnosis not present

## 2019-05-31 DIAGNOSIS — R030 Elevated blood-pressure reading, without diagnosis of hypertension: Secondary | ICD-10-CM | POA: Diagnosis not present

## 2019-05-31 DIAGNOSIS — E039 Hypothyroidism, unspecified: Secondary | ICD-10-CM | POA: Diagnosis not present

## 2019-05-31 DIAGNOSIS — Z760 Encounter for issue of repeat prescription: Secondary | ICD-10-CM | POA: Diagnosis not present

## 2019-07-06 DIAGNOSIS — K5900 Constipation, unspecified: Secondary | ICD-10-CM | POA: Diagnosis not present

## 2019-07-06 DIAGNOSIS — R030 Elevated blood-pressure reading, without diagnosis of hypertension: Secondary | ICD-10-CM | POA: Diagnosis not present

## 2019-07-06 DIAGNOSIS — E039 Hypothyroidism, unspecified: Secondary | ICD-10-CM | POA: Diagnosis not present

## 2019-07-24 DIAGNOSIS — E039 Hypothyroidism, unspecified: Secondary | ICD-10-CM | POA: Diagnosis not present

## 2019-10-23 DIAGNOSIS — Z131 Encounter for screening for diabetes mellitus: Secondary | ICD-10-CM | POA: Diagnosis not present

## 2019-10-23 DIAGNOSIS — E039 Hypothyroidism, unspecified: Secondary | ICD-10-CM | POA: Diagnosis not present

## 2019-10-23 DIAGNOSIS — E663 Overweight: Secondary | ICD-10-CM | POA: Diagnosis not present

## 2019-10-23 DIAGNOSIS — Z0001 Encounter for general adult medical examination with abnormal findings: Secondary | ICD-10-CM | POA: Diagnosis not present

## 2019-10-23 DIAGNOSIS — U071 COVID-19: Secondary | ICD-10-CM | POA: Diagnosis not present

## 2019-10-23 DIAGNOSIS — Z01411 Encounter for gynecological examination (general) (routine) with abnormal findings: Secondary | ICD-10-CM | POA: Diagnosis not present

## 2019-10-26 DIAGNOSIS — U071 COVID-19: Secondary | ICD-10-CM | POA: Diagnosis not present

## 2020-07-08 DIAGNOSIS — Z7901 Long term (current) use of anticoagulants: Secondary | ICD-10-CM | POA: Diagnosis not present

## 2020-07-08 DIAGNOSIS — Z01818 Encounter for other preprocedural examination: Secondary | ICD-10-CM | POA: Diagnosis not present

## 2021-06-11 ENCOUNTER — Encounter (HOSPITAL_COMMUNITY): Payer: Self-pay | Admitting: Emergency Medicine

## 2021-06-11 ENCOUNTER — Ambulatory Visit (HOSPITAL_COMMUNITY)
Admission: EM | Admit: 2021-06-11 | Discharge: 2021-06-11 | Disposition: A | Discharge status: Home / Self Care | Payer: BLUE CROSS/BLUE SHIELD | Attending: Physician Assistant | Admitting: Physician Assistant

## 2021-06-11 ENCOUNTER — Other Ambulatory Visit: Payer: Self-pay

## 2021-06-11 DIAGNOSIS — M62830 Muscle spasm of back: Secondary | ICD-10-CM | POA: Diagnosis not present

## 2021-06-11 MED ORDER — CYCLOBENZAPRINE HCL 5 MG PO TABS: 5.0000 mg | ORAL_TABLET | Freq: Three times a day (TID) | ORAL | 0 refills | Status: AC | PRN

## 2021-06-11 NOTE — Discharge Instructions (Signed)
Take flexeril as needed for spasm Can take ibuprofen as needed for discomfort Advise ice to affected area and light stretching, rest. Return if symptoms worsen

## 2021-06-11 NOTE — ED Triage Notes (Signed)
Reports 2 weeks of pain in back.  Patient thinks it is worsening.  Pain is right shoulder blade.  Patient is right handed.  Patient started working at current job 2 months ago, reports repetitive motion

## 2021-06-11 NOTE — ED Provider Notes (Signed)
[facility]    CSN: [id number] Arrival date & time: 06/11/21  1839      History   Chief Complaint Chief Complaint  Patient presents with   Back Pain    HPI Kimberly Bennett is a 40 y.o. female.   Pt complains of pain to her right upper pack around her shoulder blade.  Denies injury or trauma. 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  She has tried icey hot and a massage gun with minimal relief.  She denies radiation of pain, numbness, tingling.    Past Medical History:  Diagnosis Date   Abnormal Pap smear of cervix    colposcopy 2012, Jan   Chlamydia    Pregnancy induced hypertension    Trichomonas    Urinary tract infection     Patient Active Problem List   Diagnosis Date Noted   History of prior pregnancy with IUGR newborn 12/17/2015   [place] (pregnancy induced hypertension), previous postpartum condition 10/25/2015   Maternal varicella, non-immune 10/25/2015   Asthma 10/27/2011    Past Surgical History:  Procedure Laterality Date   COLPOSCOPY     DILATION AND CURETTAGE OF UTERUS     TUBAL LIGATION Bilateral 03/19/2016   Procedure: POST PARTUM TUBAL LIGATION;  Surgeon: Mora Bellman, MD;  Location: [facility];  Service: Gynecology;  Laterality: Bilateral;    OB History     Gravida  8   Para  5   Term  4   Preterm  1   AB  3   Living  5      SAB  2   IAB  1   Ectopic  0   Multiple  0   Live Births  1            Home Medications    Prior to Admission medications   Medication Sig Start Date End Date Taking? Authorizing Provider  cyclobenzaprine (FLEXERIL) 5 MG tablet Take 1 tablet (5 mg total) by mouth 3 (three) times daily as needed for muscle spasms. 06/11/21  Yes Ward, Lenise Arena, PA-C  amLODipine (NORVASC) 10 MG tablet Take 1 tablet (10 mg total) by mouth daily. Patient not taking: Reported on 06/11/2021 05/08/16   [name], CNM    Family  History Family History  Problem Relation Age of Onset   Hypertension Mother    Hypertension Maternal Grandmother    Diabetes Maternal Grandmother    Asthma Daughter    Asthma Son    Alcohol abuse Father    Diabetes Maternal Aunt    Stroke Maternal Aunt    Heart disease Maternal Aunt     Social History Social History   Tobacco Use   Smoking status: Never   Smokeless tobacco: Never  Vaping Use   Vaping Use: Never used  Substance Use Topics   Alcohol use: No   Drug use: No     Allergies   Penicillins and Latex   Review of Systems Review of Systems  Constitutional:  Negative for chills and fever.  HENT:  Negative for ear pain and sore throat.   Eyes:  Negative for pain and visual disturbance.  Respiratory:  Negative for cough and shortness of breath.   Cardiovascular:  Negative for chest pain and palpitations.  Gastrointestinal:  Negative for abdominal pain and vomiting.  Genitourinary:  Negative for dysuria and hematuria.  Musculoskeletal:  Positive for back pain. Negative for arthralgias.  Skin:  Negative for color change and rash.  Neurological:  Negative for seizures and syncope.  All other systems reviewed and are negative.   Physical Exam Triage Vital Signs ED Triage Vitals  Enc Vitals Group     BP 06/11/21 1943 120/88     Pulse Rate 06/11/21 1943 76     Resp 06/11/21 1943 18     Temp 06/11/21 1943 98.5 F (36.9 C)     Temp Source 06/11/21 1943 Oral     SpO2 06/11/21 1943 96 %     Weight --      Height --      Head Circumference --      Peak Flow --      Pain Score 06/11/21 1938 2     Pain Loc --      Pain Edu? --      Excl. in [place]? --    No data found.  Updated Vital Signs BP 120/88 (BP Location: Left Arm)   Pulse 76   Temp 98.5 F (36.9 C) (Oral)   Resp 18   LMP 06/09/2021   SpO2 96%   Visual Acuity Right Eye Distance:   Left Eye Distance:   Bilateral Distance:    Right Eye Near:   Left Eye Near:    Bilateral Near:     Physical  Exam Vitals and nursing note reviewed.  Constitutional:      General: She is not in acute distress.    Appearance: She is well-developed.  HENT:     Head: Normocephalic and atraumatic.  Eyes:     Conjunctiva/sclera: Conjunctivae normal.  Cardiovascular:     Rate and Rhythm: Normal rate and regular rhythm.     Heart sounds: No murmur heard. Pulmonary:     Effort: Pulmonary effort is normal. No respiratory distress.     Breath sounds: Normal breath sounds.  Abdominal:     Palpations: Abdomen is soft.     Tenderness: There is no abdominal tenderness.  Musculoskeletal:       Arms:     Cervical back: Neck supple.  Skin:    General: Skin is warm and dry.  Neurological:     Mental Status: She is alert.     UC Treatments / Results  Labs (all labs ordered are listed, but only abnormal results are displayed) Labs Reviewed - No data to display  EKG   Radiology No results found.  Procedures Procedures (including critical care time)  Medications Ordered in UC Medications - No data to display  Initial Impression / Assessment and Plan / UC Course  I have reviewed the triage vital signs and the nursing notes.  Pertinent labs & imaging results that were available during my care of the patient were reviewed by me and considered in my medical decision making (see chart for details).     Muscle spasm likely due to repetitive motion, no injury or trauma.  Flexeril prescribed.  Advised ibuprofen as needed.  Work note given. Return precautions discussed.  Final Clinical Impressions(s) / UC Diagnoses   Final diagnoses:  Back spasm     Discharge Instructions      Take flexeril as needed for spasm Can take ibuprofen as needed for discomfort Advise ice to affected area and light stretching, rest. Return if symptoms worsen   ED Prescriptions     Medication Sig Dispense Auth. Provider   cyclobenzaprine (FLEXERIL) 5 MG tablet Take 1 tablet (5 mg total) by mouth 3 (three)  times  daily as needed for muscle spasms. 30 tablet Ward, Lenise Arena, PA-C      PDMP not reviewed this encounter.   Ward, Lenise Arena, PA-C 06/11/21 2004

## 2024-07-02 ENCOUNTER — Encounter (HOSPITAL_COMMUNITY): Payer: Self-pay

## 2024-07-02 ENCOUNTER — Ambulatory Visit (HOSPITAL_COMMUNITY)
Admission: EM | Admit: 2024-07-02 | Discharge: 2024-07-02 | Disposition: A | Discharge status: Home / Self Care | Payer: Self-pay

## 2024-07-02 DIAGNOSIS — M62830 Muscle spasm of back: Secondary | ICD-10-CM | POA: Diagnosis not present

## 2024-07-02 MED ORDER — BACLOFEN 10 MG PO TABS: 10.0000 mg | ORAL_TABLET | Freq: Three times a day (TID) | ORAL | 0 refills | Status: AC

## 2024-07-02 MED ORDER — KETOROLAC TROMETHAMINE 30 MG/ML IJ SOLN
30.0000 mg | Freq: Once | INTRAMUSCULAR | Status: AC
  Administered 2024-07-02: 30 mg via INTRAMUSCULAR

## 2024-07-02 MED ORDER — DICLOFENAC SODIUM 75 MG PO TBEC: 75.0000 mg | DELAYED_RELEASE_TABLET | Freq: Two times a day (BID) | ORAL | 1 refills | Status: AC

## 2024-07-02 MED ORDER — KETOROLAC TROMETHAMINE 30 MG/ML IJ SOLN
INTRAMUSCULAR | Status: AC
  Filled 2024-07-02: qty 1

## 2024-07-02 NOTE — ED Provider Notes (Signed)
 [facility]  Note:  This document was prepared using Conservation officer, historic buildings and may include unintentional dictation errors.  MRN: [id number] DOB: 20-Apr-1981  Subjective:   Kimberly Bennett is a 43 y.o. female presenting for ongoing left lower back pain x 3 to 4 days.  Patient reports that she fell about 3 days ago landing on her back since then she is having muscle spasms and pain to her left lower back.  Patient reports after injury occurred she was seen in the ER and was diagnosed with muscle spasm and prescribed muscle relaxant and anti-inflammatory medication.  Patient is also been using over-the-counter Lidoderm  patches with minimal improvement to symptoms.  Patient reports that she is still having pain and medications do not seem to be helping.    No current facility-administered medications for this encounter.  Current Outpatient Medications:    baclofen (LIORESAL) 10 MG tablet, Take 1 tablet (10 mg total) by mouth 3 (three) times daily., Disp: 30 each, Rfl: 0   diclofenac (VOLTAREN) 75 MG EC tablet, Take 1 tablet (75 mg total) by mouth 2 (two) times daily., Disp: 30 tablet, Rfl: 1   ibuprofen  (ADVIL ) 800 MG tablet, Take 800 mg by mouth 3 (three) times daily., Disp: , Rfl:    lidocaine  (LIDODERM ) 5 %, SMARTSIG:Topical, Disp: , Rfl:    methocarbamol (ROBAXIN) 500 MG tablet, Take 500 mg by mouth 3 (three) times daily., Disp: , Rfl:    amLODipine  (NORVASC ) 10 MG tablet, Take 1 tablet (10 mg total) by mouth daily. (Patient not taking: Reported on 06/11/2021), Disp: 30 tablet, Rfl: 1   cyclobenzaprine  (FLEXERIL ) 5 MG tablet, Take 1 tablet (5 mg total) by mouth 3 (three) times daily as needed for muscle spasms., Disp: 30 tablet, Rfl: 0   Allergies  Allergen Reactions   Penicillins Hives, Itching and Other (See Comments)    ABLE TO TAKE CEPHALOSPORINS Has patient had a PCN reaction causing immediate rash, facial/tongue/throat swelling, SOB or lightheadedness with  hypotension: No Has patient had a PCN reaction causing severe rash involving mucus membranes or skin necrosis: No Has patient had a PCN reaction that required hospitalization No Has patient had a PCN reaction occurring within the last 10 years: No If all of the above answers are NO, then may proceed with Cephalosporin use.    Latex Rash    Past Medical History:  Diagnosis Date   Abnormal Pap smear of cervix    colposcopy 2012, Jan   Chlamydia    Pregnancy induced hypertension    Trichomonas    Urinary tract infection      Past Surgical History:  Procedure Laterality Date   COLPOSCOPY     DILATION AND CURETTAGE OF UTERUS     TUBAL LIGATION Bilateral 03/19/2016   Procedure: POST PARTUM TUBAL LIGATION;  Surgeon: Winton Felt, MD;  Location: [facility];  Service: Gynecology;  Laterality: Bilateral;    Family History  Problem Relation Age of Onset   Hypertension Mother    Hypertension Maternal Grandmother    Diabetes Maternal Grandmother    Asthma Daughter    Asthma Son    Alcohol abuse Father    Diabetes Maternal Aunt    Stroke Maternal Aunt    Heart disease Maternal Aunt     Social History   Tobacco Use   Smoking status: Never   Smokeless tobacco: Never  Vaping Use   Vaping status: Never Used  Substance Use Topics   Alcohol use: No  Drug use: No    ROS Refer to HPI for ROS details.  Objective:    Vitals: BP 116/83 (BP Location: Left Arm)   Pulse 90   Temp 98.6 F (37 C) (Oral)   Resp 18   Ht 5' 5 (1.651 m)   Wt 114 lb (51.7 kg)   LMP 06/25/2024   SpO2 96%   BMI 18.97 kg/m   Physical Exam Vitals and nursing note reviewed.  Constitutional:      General: She is not in acute distress.    Appearance: Normal appearance. She is well-developed. She is not ill-appearing or toxic-appearing.  HENT:     Head: Normocephalic and atraumatic.  Cardiovascular:     Rate and Rhythm: Normal rate.  Pulmonary:     Effort: Pulmonary effort is normal. No  respiratory distress.  Musculoskeletal:     Lumbar back: Spasms and tenderness present. No swelling, deformity or bony tenderness. Decreased range of motion. Negative right straight leg raise test and negative left straight leg raise test.  Skin:    General: Skin is warm and dry.  Neurological:     General: No focal deficit present.     Mental Status: She is alert and oriented to person, place, and time.  Psychiatric:        Mood and Affect: Mood normal.        Behavior: Behavior normal.     Procedures  No results found for this or any previous visit (from the past 24 hours).  Assessment and Plan :     Discharge Instructions       1. Muscle spasm of back (Primary) - ketorolac (TORADOL) 30 MG/ML IM injection 30 mg given in UC for acute left-sided lower back pain and muscle spasm - baclofen (LIORESAL) 10 MG tablet; Take 1 tablet (10 mg total) by mouth 3 (three) times daily.  Dispense: 30 each; Refill: 0 - diclofenac (VOLTAREN) 75 MG EC tablet; Take 1 tablet (75 mg total) by mouth 2 (two) times daily.  Dispense: 30 tablet; Refill: 1 - AMB referral to orthopedics for follow-up evaluation and ongoing management of the left lower back pain -Continue to monitor symptoms for any change in severity if there is any escalation of current symptoms or development of new symptoms follow-up in ER for further evaluation and management.      Anish Vana B Vaishnavi Dalby   [place], [place], [place] 07/02/24 1103

## 2024-07-02 NOTE — Discharge Instructions (Signed)
  1. Muscle spasm of back (Primary) - ketorolac (TORADOL) 30 MG/ML IM injection 30 mg given in UC for acute left-sided lower back pain and muscle spasm - baclofen (LIORESAL) 10 MG tablet; Take 1 tablet (10 mg total) by mouth 3 (three) times daily.  Dispense: 30 each; Refill: 0 - diclofenac (VOLTAREN) 75 MG EC tablet; Take 1 tablet (75 mg total) by mouth 2 (two) times daily.  Dispense: 30 tablet; Refill: 1 - AMB referral to orthopedics for follow-up evaluation and ongoing management of the left lower back pain -Continue to monitor symptoms for any change in severity if there is any escalation of current symptoms or development of new symptoms follow-up in ER for further evaluation and management.

## 2024-07-02 NOTE — ED Triage Notes (Signed)
 Pt states that she fell and hit her back. X3 days Pt states that she was recently seen at ER and the medications aren't working.
